# Patient Record
Sex: Female | Born: 1945 | Race: White | Hispanic: No | Marital: Single | Smoking: Former smoker
Health system: Southern US, Community
[De-identification: ages and names within clinical notes are randomized; demographics above are authoritative.]

## PROBLEM LIST (undated history)

## (undated) HISTORY — PX: MOLE REMOVAL: SHX2046

## (undated) HISTORY — PX: EYE SURGERY: SHX253

---

## 1997-06-28 ENCOUNTER — Other Ambulatory Visit: Admission: RE | Admit: 1997-06-28 | Discharge: 1997-06-28 | Payer: Self-pay | Admitting: *Deleted

## 1997-07-07 ENCOUNTER — Emergency Department (HOSPITAL_COMMUNITY): Admission: EM | Admit: 1997-07-07 | Discharge: 1997-07-07 | Payer: Self-pay

## 1997-07-11 ENCOUNTER — Ambulatory Visit (HOSPITAL_COMMUNITY): Admission: RE | Admit: 1997-07-11 | Discharge: 1997-07-11 | Payer: Self-pay

## 1997-11-09 ENCOUNTER — Ambulatory Visit (HOSPITAL_COMMUNITY): Admission: RE | Admit: 1997-11-09 | Discharge: 1997-11-09 | Payer: Self-pay | Admitting: *Deleted

## 1999-01-28 ENCOUNTER — Ambulatory Visit (HOSPITAL_COMMUNITY): Admission: RE | Admit: 1999-01-28 | Discharge: 1999-01-28 | Payer: Self-pay | Admitting: *Deleted

## 1999-04-15 ENCOUNTER — Other Ambulatory Visit: Admission: RE | Admit: 1999-04-15 | Discharge: 1999-04-15 | Payer: Self-pay | Admitting: *Deleted

## 1999-08-19 ENCOUNTER — Encounter: Admission: RE | Admit: 1999-08-19 | Discharge: 1999-10-07 | Payer: Self-pay | Admitting: Internal Medicine

## 2000-04-13 ENCOUNTER — Ambulatory Visit (HOSPITAL_COMMUNITY): Admission: RE | Admit: 2000-04-13 | Discharge: 2000-04-13 | Payer: Self-pay | Admitting: Internal Medicine

## 2000-04-13 ENCOUNTER — Encounter: Payer: Self-pay | Admitting: Family Medicine

## 2000-09-15 ENCOUNTER — Emergency Department (HOSPITAL_COMMUNITY): Admission: EM | Admit: 2000-09-15 | Discharge: 2000-09-16 | Payer: Self-pay | Admitting: Internal Medicine

## 2000-09-17 ENCOUNTER — Encounter: Admission: RE | Admit: 2000-09-17 | Discharge: 2000-09-17 | Payer: Self-pay | Admitting: Endocrinology

## 2000-09-17 ENCOUNTER — Encounter: Payer: Self-pay | Admitting: Endocrinology

## 2000-11-05 ENCOUNTER — Ambulatory Visit (HOSPITAL_COMMUNITY): Admission: RE | Admit: 2000-11-05 | Discharge: 2000-11-05 | Payer: Self-pay | Admitting: *Deleted

## 2005-12-26 ENCOUNTER — Encounter: Admission: RE | Admit: 2005-12-26 | Discharge: 2005-12-26 | Payer: Self-pay | Admitting: Internal Medicine

## 2016-02-05 ENCOUNTER — Telehealth: Payer: Self-pay | Admitting: Rheumatology

## 2016-02-05 NOTE — Telephone Encounter (Signed)
Patient called to check status of referral. Patient states her PCP sent it in December.

## 2016-02-07 NOTE — Telephone Encounter (Signed)
Okay to schedule

## 2016-04-13 NOTE — Progress Notes (Deleted)
   Office Visit Note  Patient: Carol Horn             Date of Birth: [birth year]           MRN: [id number]             PCP: No primary care provider on file. Referring: No ref. provider found Visit Date: 04/17/2016 Occupation: @GUAROCC @    Subjective:  No chief complaint on file.   History of Present Illness: Carol Horn is a 71 y.o. female ***   Activities of Daily Living:  Patient reports morning stiffness for *** {minute/hour:19697}.   Patient {ACTIONS;DENIES/REPORTS:21021675::"Denies"} nocturnal pain.  Difficulty dressing/grooming: {ACTIONS;DENIES/REPORTS:21021675::"Denies"} Difficulty climbing stairs: {ACTIONS;DENIES/REPORTS:21021675::"Denies"} Difficulty getting out of chair: {ACTIONS;DENIES/REPORTS:21021675::"Denies"} Difficulty using hands for taps, buttons, cutlery, and/or writing: {ACTIONS;DENIES/REPORTS:21021675::"Denies"}   No Rheumatology ROS completed.   PMFS History:  There are no active problems to display for this patient.   No past medical history on file.  No family history on file. No past surgical history on file. Social History   Social History Narrative  . No narrative on file     Objective: Vital Signs: There were no vitals taken for this visit.   Physical Exam   Musculoskeletal Exam: ***  CDAI Exam: No CDAI exam completed.    Investigation: No additional findings.   Imaging: No results found.  Speciality Comments: No specialty comments available.    Procedures:  No procedures performed Allergies: Patient has no allergy information on record.   Assessment / Plan:     Visit Diagnoses: No diagnosis found.    Orders: No orders of the defined types were placed in this encounter.  No orders of the defined types were placed in this encounter.   Face-to-face time spent with patient was *** minutes. 50% of time was spent in counseling and coordination of care.  Follow-Up Instructions: No Follow-up on  file.   Bo Merino, MD  Note - This record has been created using Editor, commissioning.  Chart creation errors have been sought, but may not always  have been located. Such creation errors do not reflect on  the standard of medical care.

## 2016-04-16 ENCOUNTER — Telehealth: Payer: Self-pay | Admitting: Radiology

## 2016-04-16 NOTE — Telephone Encounter (Signed)
Tried to call patient to get records sent. Her mobile number is busy. She had referral that was sent, unfortunately when she declined appointment the referral was apparently shredded, she then called back to schedule. We need information from her PCP  Will you try to call her later?

## 2016-04-16 NOTE — Telephone Encounter (Signed)
Tried calling numbers on file for patient. Mobile number was busy and there was no answer or voicemail available for the work number on file.

## 2016-04-17 ENCOUNTER — Ambulatory Visit: Payer: Self-pay | Admitting: Rheumatology

## 2016-05-04 NOTE — Progress Notes (Deleted)
   Office Visit Note  Patient: Carol Horn             Date of Birth: [birth year]           MRN: [id number]             PCP: No primary care provider on file. Referring: No ref. provider found Visit Date: 05/07/2016 Occupation: @GUAROCC @    Subjective:  No chief complaint on file.   History of Present Illness: Carol Horn is a 71 y.o. female ***   Activities of Daily Living:  Patient reports morning stiffness for *** {minute/hour:19697}.   Patient {ACTIONS;DENIES/REPORTS:21021675::"Denies"} nocturnal pain.  Difficulty dressing/grooming: {ACTIONS;DENIES/REPORTS:21021675::"Denies"} Difficulty climbing stairs: {ACTIONS;DENIES/REPORTS:21021675::"Denies"} Difficulty getting out of chair: {ACTIONS;DENIES/REPORTS:21021675::"Denies"} Difficulty using hands for taps, buttons, cutlery, and/or writing: {ACTIONS;DENIES/REPORTS:21021675::"Denies"}   No Rheumatology ROS completed.   PMFS History:  There are no active problems to display for this patient.   No past medical history on file.  No family history on file. No past surgical history on file. Social History   Social History Narrative  . No narrative on file     Objective: Vital Signs: There were no vitals taken for this visit.   Physical Exam   Musculoskeletal Exam: ***  CDAI Exam: No CDAI exam completed.    Investigation: No additional findings.   Imaging: No results found.  Speciality Comments: No specialty comments available.    Procedures:  No procedures performed Allergies: Patient has no allergy information on record.   Assessment / Plan:     Visit Diagnoses: No diagnosis found.    Orders: No orders of the defined types were placed in this encounter.  No orders of the defined types were placed in this encounter.   Face-to-face time spent with patient was *** minutes. 50% of time was spent in counseling and coordination of care.  Follow-Up Instructions: No Follow-up on  file.   Bo Merino, MD  Note - This record has been created using Editor, commissioning.  Chart creation errors have been sought, but may not always  have been located. Such creation errors do not reflect on  the standard of medical care.

## 2016-05-07 ENCOUNTER — Ambulatory Visit: Payer: Self-pay | Admitting: Rheumatology

## 2016-06-21 NOTE — Progress Notes (Signed)
Office Visit Note  Patient: Carol Horn             Date of Birth: October 11, 1945           MRN: [id number]             PCP: Chalmers Guest, FNP Referring: No ref. provider found Visit Date: 06/23/2016 Occupation: @GUAROCC @    Subjective:  Pain hands.   History of Present Illness: Carol Horn is a 71 y.o. female with history of psoriasis and psoriatic arthritis who is self-referred herself. According to patient she has had history of psoriasis since she was in her 13s. At age 28 she started having joint pain and worsening of her psoriasis. She describes joint pain in her hands and feet. She was seen by dermatologist who gave her topical agents and also sulfasalazine. She was also given light therapy. She's been under care of Dr. Lyman Speller a dermatologist at [facility] for the last few years. She states due to history of hepatitis C which was diagnosed at age 34 she could not take methotrexate. She was tried on several anti-inflammatories in the past. She was started on Enbrel in 2002. She states she did really well on Enbrel. But after 15 years of usage still working 2 years ago. In September 2017 she had to come off Enbrel due to a skin infection. In 2010 she was given her first dose of the Stelara followed by second dose in November 2017 she states she could not afford the medication as her co-pay was very high about $3000. She could not get any help from any of the foundations. She was not offered any appointments back with Dr. Lyman Speller. She states she has some leftover Enbrel injections which she's been taking every 2-3 weeks. She's been having a flare with increased rash over her extremities and increased joint pain. She describes pain mostly in her hands or feet or lower back. She could not take treatment for hepatitis C as she was also positive for hepatitis B. Patient had a colonoscopy at age 64 and not since then.  Activities of Daily Living:  Patient reports morning  stiffness for 1 hour.   Patient Reports nocturnal pain.  Difficulty dressing/grooming: Denies Difficulty climbing stairs: Reports Difficulty getting out of chair: Denies Difficulty using hands for taps, buttons, cutlery, and/or writing: Reports   Review of Systems  Constitutional: Positive for fatigue. Negative for night sweats, weight gain, weight loss and weakness.  HENT: Positive for mouth dryness. Negative for mouth sores, trouble swallowing, trouble swallowing and nose dryness.   Eyes: Positive for dryness. Negative for pain, redness and visual disturbance.  Respiratory: Negative for cough, shortness of breath and difficulty breathing.   Cardiovascular: Positive for hypertension. Negative for chest pain, palpitations, irregular heartbeat and swelling in legs/feet.  Gastrointestinal: Negative for blood in stool, constipation and diarrhea.  Endocrine: Negative for increased urination.  Genitourinary: Negative for vaginal dryness.  Musculoskeletal: Positive for arthralgias, joint pain, joint swelling, myalgias and myalgias. Negative for muscle weakness, morning stiffness and muscle tenderness.  Skin: Positive for rash. Negative for color change, hair loss, skin tightness, ulcers and sensitivity to sunlight.  Allergic/Immunologic: Negative for susceptible to infections.  Neurological: Negative for dizziness, memory loss and night sweats.  Hematological: Negative for swollen glands.  Psychiatric/Behavioral: Negative for depressed mood and sleep disturbance. The patient is not nervous/anxious.     PMFS History:  Patient Active Problem List   Diagnosis Date Noted  . Psoriasis  06/23/2016  . Psoriatic arthropathy ([place]) 06/23/2016  . High risk medication use 06/23/2016  . Chronic hepatitis C without hepatic coma ([place]) 06/23/2016    No past medical history on file.  No family history on file. No past surgical history on file. Social History   Social History Narrative  . No narrative  on file     Objective: Vital Signs: BP 128/76   Pulse 78   Resp 14   Ht 5\' 3"  (1.6 m)   Wt 172 lb (78 kg)   BMI 30.47 kg/m    Physical Exam  Constitutional: She is oriented to person, place, and time. She appears well-developed and well-nourished.  HENT:  Head: Normocephalic and atraumatic.  Eyes: Conjunctivae and EOM are normal.  Neck: Normal range of motion.  Cardiovascular: Normal rate, regular rhythm, normal heart sounds and intact distal pulses.   Pulmonary/Chest: Effort normal and breath sounds normal.  Abdominal: Soft. Bowel sounds are normal.  Lymphadenopathy:    She has no cervical adenopathy.  Neurological: She is alert and oriented to person, place, and time.  Skin: Skin is warm and dry. Capillary refill takes less than 2 seconds. Rash noted.  Erythematous plaques on extremities from psoriasis  Psychiatric: She has a normal mood and affect. Her behavior is normal.  Nursing note and vitals reviewed.    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 She has synovial thickening over bilateral PIP/DIP joints with no active synovitis noted. She has limited painful range of motion of her bilateral hip joints knee joints are good range of motion. She has some DIP PIP thickening in her feet with no active synovitis.  CDAI Exam: CDAI Homunculus Exam:   Tenderness:  Right hand: 2nd DIP, 3rd DIP, 4th DIP and 5th DIP Left hand: 2nd DIP, 3rd DIP, 4th DIP and 5th DIP RLE: acetabulofemoral LLE: acetabulofemoral  Joint Counts:  CDAI Tender Joint count: 0 CDAI Swollen Joint count: 0  Global Assessments:  Patient Global Assessment: 5 Provider Global Assessment: 5  CDAI Calculated Score: 10    Investigation: No additional findings.   Imaging: Xr Hip Unilat  W Or W/o Pelvis 2-3 Views Left  Result Date: 06/23/2016 No significant hip joint narrowing was noted. Mild SI joint changes were noted. Left hip joint was within normal limits.  Xr Hip Unilat W Or W/o Pelvis 2-3 Views Right  Result Date: 06/23/2016 No significant hip joint narrowing was noted. Right SI joint sclerosis was noted. 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 Erosive changes in left fifth MTP joint was noted. Intertarsal joint space narrowing was noted. A small calcaneal spur was noted. Impression: Findings are consistent with psoriatic arthritis with erosive disease.  Xr Foot 2 Views Right  Result Date: 06/23/2016 All MTP PIP/DIP narrowing was noted. Subluxation of PIP joints were noted. Erosive changes in her PIP joints were noted. Severe erosive changes noted in right fourth and fifth MTP joints. Intertarsal joint space narrowing was noted. Erosive changes and tarsal bones were noted. A small calcaneal spur was noted. Impression: These findings are consistent with psoriatic arthritis with erosive changes  Xr Hand 2 View Right  Result Date: 06/23/2016 Ankylosis of all DIP joints noted.  Severe PIP narrowing was noted. With subluxation of right third and fourth PIP joint and ankylosis of right fifth PIP joint. She has narrowing and erosion of her right first MCP joint some intercarpal joint and radiocarpal joint narrowing was noted. Erosion over the ulnar styloid was noted. Impression: These findings are consistent with psoriatic arthritis with erosive changes  Xr Knee 3 View Left  Result Date: 06/23/2016 Moderate medial compartment narrowing no chondrocalcinosis moderate patellofemoral narrowing. Impression: Moderate osteoarthritis and moderate chondromalacia patella  Xr Knee 3 View Right  Result Date: 06/23/2016 Moderate medial compartment of knee joint without  chondrocalcinosis mild patellofemoral narrowing noted. Impression: Moderate osteoarthritis and mild chondromalacia patella   Speciality Comments: No specialty comments available.    Procedures:  No procedures performed Allergies: Hydrochlorothiazide and Stelara [ustekinumab]   Assessment / Plan:     Visit Diagnoses: Psoriatic arthropathy ([place]): She continues to have some DIP PIP thickening but no active synovitis was noted. She's been on Enbrel since 2002. She tried to [facility] but had no response to that. She believes that Enbrel was not effective as it used to be. With her history of hepatitis B and hepatitis C her choices are very limited. She had difficulty getting her medications due to her insurance situation. And she could not get any patient assistance per patient. She's not seeing any dermatologist currently. She has severe psoriatic arthritis with erosive changes in her PIPs of her hands and also in her feet.  Psoriasis: She is active lesions on her extremities.  High risk medication use: She takes Enbrel subcutaneous every 2-3 weeks. Which is her leftover medication. Her choices are going to be very limited with hepatitis B and hepatitis C combination. Rutherford Nail could be a possible option I'm uncertain how effective it's going to be.  Chronic hepatitis C without hepatic coma ([place]): I will obtain hep C RNA Quant  History of hepatitis B: The labs obtained hep B DNA Quant  History of hypertension: Blood pressure appears to be well controlled  Congenital absence of forearm only, left  Family history of psoriatic arthritis - Mother    Orders: Orders Placed This Encounter  Procedures  . XR Hand 2 View Right  . XR KNEE 3 VIEW RIGHT  . XR KNEE 3 VIEW LEFT  . XR HIP UNILAT W OR W/O PELVIS 2-3 VIEWS LEFT  . XR HIP UNILAT W OR W/O PELVIS 2-3 VIEWS RIGHT  . XR Foot 2 Views Left  . XR Foot 2 Views Right  . CBC with Differential/Platelet  . COMPLETE METABOLIC PANEL WITH GFR  .  Sedimentation rate  . CK  . Antinuclear Antib (ANA)  . Quantiferon tb gold assay (blood)  . HIV antibody  . Hepatitis B DNA, ultraquantitative, PCR  . Hepatitis C RNA quantitative  . Serum protein electrophoresis with reflex  . IgG, IgA, IgM   No orders of the defined types were placed in this encounter.   Face-to-face time spent with patient was 50 minutes. 50% of time was spent in counseling and coordination of care.  Follow-Up Instructions: Return for Psoriatic arthritis, psoriasis.   Bo Merino, MD  Note - This record has been created using Editor, commissioning.  Chart creation errors have been sought, but may not always  have been located. Such creation errors do not reflect on  the standard of medical care.

## 2016-06-23 ENCOUNTER — Ambulatory Visit (INDEPENDENT_AMBULATORY_CARE_PROVIDER_SITE_OTHER): Payer: Medicare Other

## 2016-06-23 ENCOUNTER — Encounter: Payer: Self-pay | Admitting: Rheumatology

## 2016-06-23 ENCOUNTER — Encounter (INDEPENDENT_AMBULATORY_CARE_PROVIDER_SITE_OTHER): Payer: Self-pay

## 2016-06-23 ENCOUNTER — Ambulatory Visit (INDEPENDENT_AMBULATORY_CARE_PROVIDER_SITE_OTHER): Payer: Medicare Other | Admitting: Rheumatology

## 2016-06-23 VITALS — BP 128/76 | HR 78 | Resp 14 | Ht 63.0 in | Wt 172.0 lb

## 2016-06-23 DIAGNOSIS — M25552 Pain in left hip: Secondary | ICD-10-CM | POA: Diagnosis not present

## 2016-06-23 DIAGNOSIS — M25561 Pain in right knee: Secondary | ICD-10-CM

## 2016-06-23 DIAGNOSIS — L4050 Arthropathic psoriasis, unspecified: Secondary | ICD-10-CM

## 2016-06-23 DIAGNOSIS — L409 Psoriasis, unspecified: Secondary | ICD-10-CM

## 2016-06-23 DIAGNOSIS — M79671 Pain in right foot: Secondary | ICD-10-CM | POA: Diagnosis not present

## 2016-06-23 DIAGNOSIS — Q7152 Longitudinal reduction defect of left ulna: Secondary | ICD-10-CM

## 2016-06-23 DIAGNOSIS — Z8679 Personal history of other diseases of the circulatory system: Secondary | ICD-10-CM

## 2016-06-23 DIAGNOSIS — Z111 Encounter for screening for respiratory tuberculosis: Secondary | ICD-10-CM

## 2016-06-23 DIAGNOSIS — M25551 Pain in right hip: Secondary | ICD-10-CM | POA: Diagnosis not present

## 2016-06-23 DIAGNOSIS — Z840 Family history of diseases of the skin and subcutaneous tissue: Secondary | ICD-10-CM

## 2016-06-23 DIAGNOSIS — M25562 Pain in left knee: Secondary | ICD-10-CM | POA: Diagnosis not present

## 2016-06-23 DIAGNOSIS — Q7142 Longitudinal reduction defect of left radius: Secondary | ICD-10-CM | POA: Diagnosis not present

## 2016-06-23 DIAGNOSIS — Z79899 Other long term (current) drug therapy: Secondary | ICD-10-CM | POA: Diagnosis not present

## 2016-06-23 DIAGNOSIS — Z8619 Personal history of other infectious and parasitic diseases: Secondary | ICD-10-CM | POA: Diagnosis not present

## 2016-06-23 DIAGNOSIS — M79672 Pain in left foot: Secondary | ICD-10-CM | POA: Diagnosis not present

## 2016-06-23 DIAGNOSIS — Z8261 Family history of arthritis: Secondary | ICD-10-CM

## 2016-06-23 DIAGNOSIS — B182 Chronic viral hepatitis C: Secondary | ICD-10-CM

## 2016-06-23 DIAGNOSIS — M79641 Pain in right hand: Secondary | ICD-10-CM

## 2016-06-23 DIAGNOSIS — G8929 Other chronic pain: Secondary | ICD-10-CM

## 2016-06-23 DIAGNOSIS — Z114 Encounter for screening for human immunodeficiency virus [HIV]: Secondary | ICD-10-CM

## 2016-06-23 LAB — COMPLETE METABOLIC PANEL WITH GFR
ALT: 42 U/L — AB (ref 6–29)
AST: 83 U/L — AB (ref 10–35)
Albumin: 3.2 g/dL — ABNORMAL LOW (ref 3.6–5.1)
Alkaline Phosphatase: 114 U/L (ref 33–130)
BILIRUBIN TOTAL: 1.6 mg/dL — AB (ref 0.2–1.2)
BUN: 13 mg/dL (ref 7–25)
CO2: 25 mmol/L (ref 20–31)
Calcium: 9 mg/dL (ref 8.6–10.4)
Chloride: 109 mmol/L (ref 98–110)
Creat: 0.9 mg/dL (ref 0.60–0.93)
GFR, EST NON AFRICAN AMERICAN: 65 mL/min (ref >=60)
GFR, Est African American: 75 mL/min (ref >=60)
Glucose, Bld: 85 mg/dL (ref 65–99)
Potassium: 3.5 mmol/L (ref 3.5–5.3)
SODIUM: 143 mmol/L (ref 135–146)
TOTAL PROTEIN: 7.6 g/dL (ref 6.1–8.1)

## 2016-06-23 LAB — CBC WITH DIFFERENTIAL/PLATELET
BASOS PCT: 1 %
Basophils Absolute: 50 cells/uL (ref 0–200)
EOS PCT: 2 %
Eosinophils Absolute: 100 cells/uL (ref 15–500)
HCT: 38.4 % (ref 35.0–45.0)
Hemoglobin: 13.1 g/dL (ref 11.7–15.5)
LYMPHS ABS: 1550 {cells}/uL (ref 850–3900)
Lymphocytes Relative: 31 %
MCH: 34 pg — AB (ref 27.0–33.0)
MCHC: 34.1 g/dL (ref 32.0–36.0)
MCV: 99.7 fL (ref 80.0–100.0)
MONOS PCT: 13 %
MPV: 11.1 fL (ref 7.5–12.5)
Monocytes Absolute: 650 cells/uL (ref 200–950)
NEUTROS ABS: 2650 {cells}/uL (ref 1500–7800)
Neutrophils Relative %: 53 %
PLATELETS: 157 10*3/uL (ref 140–400)
RBC: 3.85 MIL/uL (ref 3.80–5.10)
RDW: 15.8 % — ABNORMAL HIGH (ref 11.0–15.0)
WBC: 5 10*3/uL (ref 3.8–10.8)

## 2016-06-23 NOTE — Progress Notes (Signed)
Pharmacy Note  Subjective:  Patient presents today to the [facility] to see Dr. Estanislado Pandy.  Rutherford Nail is being considered for patient's psoriatic arthritis.  Patient was seen by the pharmacist for counseling on Otezla.  Objective: CMP ordered today  Assessment/Plan:  Counseled patient that Rutherford Nail is a PDE 4 inhibitor that works to treat psoriasis and the joint pain and tenderness of psoriatic arthritis.  Counseled patient on purpose, proper use, and adverse effects of Otezla.  Reviewed the most common adverse effects of weight loss, depression, nausea/diarrhea/vomiting, headaches, and nasal congestion.  Provided patient with medication education material and answered all questions.  Patient consented to [place].  Will apply for [place] through [employer].  Will also assist patient in applying for [facility] patient assistance program as she is concerned about the cost of [place].  Patient was given patient portion of application.  Advised her to bring back patient portion along with required income information.  Patient voiced understanding and reports she will probably bring back [place] patient assistance application on Friday.  Will submit application once patient brings back her portion.      [name], Pharm.D., BCPS, CPP Clinical Pharmacist Pager: [phone] Phone: [phone] 06/23/2016 10:09 AM

## 2016-06-23 NOTE — Patient Instructions (Signed)
Apremilast oral tablets What is this medicine? APREMILAST (a PRE mil ast) is used to treat plaque psoriasis and psoriatic arthritis. This medicine may be used for other purposes; ask your health care provider or pharmacist if you have questions. COMMON BRAND NAME(S): Otezla What should I tell my health care provider before I take this medicine? They need to know if you have any of these conditions: -dehydration -kidney disease -mental illness -an unusual or allergic reaction to apremilast, other medicines, foods, dyes, or preservatives -pregnant or trying to get pregnant -breast-feeding How should I use this medicine? Take this medicine by mouth with a glass of water. Follow the directions on the prescription label. Do not cut, crush or chew this medicine. You can take it with or without food. If it upsets your stomach, take it with food. Take your medicine at regular intervals. Do not take it more often than directed. Do not stop taking except on your doctor's advice. Talk to your pediatrician regarding the use of this medicine in children. Special care may be needed. Overdosage: If you think you have taken too much of this medicine contact a poison control center or emergency room at once. NOTE: This medicine is only for you. Do not share this medicine with others. What if I miss a dose? If you miss a dose, take it as soon as you can. If it is almost time for your next dose, take only that dose. Do not take double or extra doses. What may interact with this medicine? This medicine may interact with the following medications: -certain medicines for seizures like carbamazepine, phenobarbital, phenytoin -rifampin This list may not describe all possible interactions. Give your health care provider a list of all the medicines, herbs, non-prescription drugs, or dietary supplements you use. Also tell them if you smoke, drink alcohol, or use illegal drugs. Some items may interact with your  medicine. What should I watch for while using this medicine? Tell your doctor or healthcare professional if your symptoms do not start to get better or if they get worse. Patients and their families should watch out for new or worsening depression or thoughts of suicide. Also watch out for sudden changes in feelings such as feeling anxious, agitated, panicky, irritable, hostile, aggressive, impulsive, severely restless, overly excited and hyperactive, or not being able to sleep. If this happens, call your health care professional. Check with your doctor or health care professional if you get an attack of severe diarrhea, nausea and vomiting, or if you sweat a lot. The loss of too much body fluid can make it dangerous for you to take this medicine. What side effects may I notice from receiving this medicine? Side effects that you should report to your doctor or health care professional as soon as possible: -depressed mood -weight loss Side effects that usually do not require medical attention (report to your doctor or health care professional if they continue or are bothersome): -diarrhea -headache -nausea, vomiting This list may not describe all possible side effects. Call your doctor for medical advice about side effects. You may report side effects to FDA at 1-800-FDA-1088. Where should I keep my medicine? Keep out of the reach of children. Store below 30 degrees C (86 degrees F). Throw away any unused medicine after the expiration date. NOTE: This sheet is a summary. It may not cover all possible information. If you have questions about this medicine, talk to your doctor, pharmacist, or health care provider.  2018 Elsevier/Gold Standard (2015-07-31 10:55:44)  

## 2016-06-24 ENCOUNTER — Other Ambulatory Visit: Payer: Self-pay | Admitting: Radiology

## 2016-06-24 DIAGNOSIS — D899 Disorder involving the immune mechanism, unspecified: Principal | ICD-10-CM

## 2016-06-24 DIAGNOSIS — D849 Immunodeficiency, unspecified: Secondary | ICD-10-CM

## 2016-06-24 LAB — IGG, IGA, IGM
IGA: 433 mg/dL (ref 81–463)
IGM, SERUM: 151 mg/dL (ref 48–271)
IgG (Immunoglobin G), Serum: 2749 mg/dL — ABNORMAL HIGH (ref 694–1618)

## 2016-06-24 LAB — HIV ANTIBODY (ROUTINE TESTING W REFLEX): HIV 1&2 Ab, 4th Generation: NONREACTIVE

## 2016-06-24 LAB — CK: Total CK: 66 U/L (ref 29–143)

## 2016-06-24 LAB — QUANTIFERON TB GOLD ASSAY (BLOOD)

## 2016-06-24 LAB — SEDIMENTATION RATE: Sed Rate: 11 mm/hr (ref 0–30)

## 2016-06-24 LAB — ANTI-NUCLEAR AB-TITER (ANA TITER)

## 2016-06-24 LAB — ANA: ANA: POSITIVE — AB

## 2016-06-25 ENCOUNTER — Telehealth: Payer: Self-pay

## 2016-06-25 LAB — PROTEIN ELECTROPHORESIS, SERUM, WITH REFLEX
ALPHA-2-GLOBULIN: 0.6 g/dL (ref 0.5–0.9)
Albumin ELP: 3.3 g/dL — ABNORMAL LOW (ref 3.8–4.8)
Alpha-1-Globulin: 0.3 g/dL (ref 0.2–0.3)
Beta 2: 0.3 g/dL (ref 0.2–0.5)
Beta Globulin: 0.5 g/dL (ref 0.4–0.6)
Gamma Globulin: 2.6 g/dL — ABNORMAL HIGH (ref 0.8–1.7)
Total Protein, Serum Electrophoresis: 7.6 g/dL (ref 6.1–8.1)

## 2016-06-25 LAB — HEPATITIS B DNA, ULTRAQUANTITATIVE, PCR: Hepatitis B DNA: <10 IU/mL

## 2016-06-25 LAB — HEPATITIS C RNA QUANTITATIVE
HCV Quantitative Log: 5.95 Log IU/mL — ABNORMAL HIGH
HCV Quantitative: 886000 IU/mL — ABNORMAL HIGH

## 2016-06-25 NOTE — Telephone Encounter (Signed)
Received a fax from [employer] regarding a prior authorization approval for Otezla through 01/25/17   Reference number:[id number] Phone number:[phone]  Will send document to scan center.  Praise Dolecki, [place], CPhT  2:40 PM

## 2016-06-25 NOTE — Telephone Encounter (Signed)
Submitted a prior authorization for [facility] through cover my meds. Will update once we receive a response.   Devion Chriscoe, [place], CPhT 11:33 AM

## 2016-06-26 ENCOUNTER — Telehealth: Payer: Self-pay | Admitting: Pharmacist

## 2016-06-26 ENCOUNTER — Other Ambulatory Visit: Payer: Self-pay

## 2016-06-26 DIAGNOSIS — D899 Disorder involving the immune mechanism, unspecified: Principal | ICD-10-CM

## 2016-06-26 DIAGNOSIS — D849 Immunodeficiency, unspecified: Secondary | ICD-10-CM

## 2016-06-26 NOTE — Telephone Encounter (Signed)
Patient came by the office and brought by [facility] patient assistance application and income statements.  Application was submitted to [facility] support.  Will update patient once we know status of application.   [name], Pharm.D., BCPS, CPP Clinical Pharmacist Pager: [phone] Phone: [phone] 06/26/2016 3:07 PM

## 2016-06-29 LAB — QUANTIFERON TB GOLD ASSAY (BLOOD)
INTERFERON GAMMA RELEASE ASSAY: NEGATIVE
Mitogen-Nil: 3.07 IU/mL
QUANTIFERON NIL VALUE: 0.04 [IU]/mL
Quantiferon Tb Ag Minus Nil Value: <0 IU/mL

## 2016-06-29 NOTE — Progress Notes (Signed)
WNL

## 2016-06-30 ENCOUNTER — Telehealth: Payer: Self-pay | Admitting: Rheumatology

## 2016-06-30 DIAGNOSIS — R899 Unspecified abnormal finding in specimens from other organs, systems and tissues: Secondary | ICD-10-CM

## 2016-06-30 DIAGNOSIS — B182 Chronic viral hepatitis C: Secondary | ICD-10-CM

## 2016-06-30 LAB — IFE INTERPRETATION

## 2016-06-30 NOTE — Telephone Encounter (Signed)
I advised patient that Carol Horn is still expensive with insurance which is why we are applying for [facility] patient assistance program.  We are still waiting on the program to process her application.  Will update her once we know status of application.   [name], Pharm.D., BCPS, CPP Clinical Pharmacist Pager: [phone] Phone: [phone] 06/30/2016 2:11 PM

## 2016-06-30 NOTE — Telephone Encounter (Signed)
-----   Message from Bo Merino, MD sent at 06/30/2016 12:52 PM EDT ----- Madaline Brilliant to apply for [place]. Refer tp Hepatitis clinic for tt of Hep C.Refer to hematology for abnormal IFE

## 2016-06-30 NOTE — Telephone Encounter (Signed)
Patient received authorization for [place] from [employer]. Patient wants to know next set. Please advise.

## 2016-06-30 NOTE — Progress Notes (Signed)
Ok to apply for [employer]. Refer tp Hepatitis clinic for tt of Hep C.Refer to hematology for abnormal IFE

## 2016-07-01 ENCOUNTER — Telehealth (INDEPENDENT_AMBULATORY_CARE_PROVIDER_SITE_OTHER): Payer: Self-pay | Admitting: Radiology

## 2016-07-01 NOTE — Telephone Encounter (Signed)
Patient calling she recently had lab work done with Dr. Estanislado Pandy. She has an appointment with her medical doctor Chalmers Guest on 6/21. She is wanting labs faxed to [phone]. I advised her they already may have access to these since [facility] is on EPIC. If unable to be faxed please call patient back.

## 2016-07-01 NOTE — Telephone Encounter (Signed)
Dr. D pt 

## 2016-07-01 NOTE — Telephone Encounter (Signed)
Patient advised labs have been faxed as requested to PCP.

## 2016-07-10 ENCOUNTER — Telehealth: Payer: Self-pay

## 2016-07-10 NOTE — Telephone Encounter (Signed)
Called otezla support plus to check status of patient's application. Spoke with Stanton Kidney who confirmed that the patient meets the qualifications and has been approved. We should receive a fax by Monday, June 18th. They will also reach out to patient to have medication shipped out.   Reference number: [id number] Phone number: [phone]  Spoke to patient to give her the update. She voices understanding and denies any questions at this time.  Agron Swiney, [place], CPhT 3:56 PM

## 2016-07-24 ENCOUNTER — Telehealth: Payer: Self-pay | Admitting: Rheumatology

## 2016-07-24 NOTE — Telephone Encounter (Signed)
Patient called stating that she has not started the new medication yet and is wanting to know if she needs to reschedule her NPT FU.  She has an appointment on Tuesday July 3.  Thank you.

## 2016-07-27 DIAGNOSIS — D696 Thrombocytopenia, unspecified: Secondary | ICD-10-CM | POA: Insufficient documentation

## 2016-07-27 NOTE — Telephone Encounter (Signed)
Left message to advise patient she should keep follow up appointment for 07/28/16

## 2016-07-27 NOTE — Progress Notes (Deleted)
   Office Visit Note  Patient: Carol Horn             Date of Birth: 30-Jan-1945           MRN: [id number]             PCP: Chalmers Guest, FNP Referring: No ref. provider found Visit Date: 07/28/2016 Occupation: @GUAROCC @    Subjective:  No chief complaint on file.   History of Present Illness: Carol Horn is a 71 y.o. female ***   Activities of Daily Living:  Patient reports morning stiffness for *** {minute/hour:19697}.   Patient {ACTIONS;DENIES/REPORTS:21021675::"Denies"} nocturnal pain.  Difficulty dressing/grooming: {ACTIONS;DENIES/REPORTS:21021675::"Denies"} Difficulty climbing stairs: {ACTIONS;DENIES/REPORTS:21021675::"Denies"} Difficulty getting out of chair: {ACTIONS;DENIES/REPORTS:21021675::"Denies"} Difficulty using hands for taps, buttons, cutlery, and/or writing: {ACTIONS;DENIES/REPORTS:21021675::"Denies"}   No Rheumatology ROS completed.   PMFS History:  Patient Active Problem List   Diagnosis Date Noted  . Thrombocytopenia ([place]) 07/27/2016  . Psoriasis 06/23/2016  . 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 High risk medication use 06/23/2016  . Chronic hepatitis C without hepatic coma ([place]) 06/23/2016    No past medical history on file.  No family history on file. No past surgical history on file. Social History   Social History Narrative  . No narrative on file     Objective: Vital Signs: There were no vitals taken for this visit.   Physical Exam   Musculoskeletal Exam: ***  CDAI Exam: No CDAI exam completed.    Investigation: Findings:  06/23/2016 ANA positive titer 1:80, Hepatitis C RNA quantitative elevated 886,000 Quant Log elevate 5.96,IgG 2,749 High , IgA 433 IgM 151, Serum protein electrophoresis with reflex A poorly defined band of restricted protein mobility is detected in the gamma globulins. It is unlikely this may represent a monoclonal protein, CK 66, Hepatitis B DNA, ultraquantitative, PCR not detected, HIV  antibody non reactive, IFE Interpretation abnormal protein not detected ,sedimentation rate 11, and on 06/26/2016 negative TB gold     Imaging: No results found.  Speciality Comments: No specialty comments available.    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  No orders of the defined types were placed in this encounter.   Face-to-face time spent with patient was *** minutes. 50% of time was spent in counseling and coordination of care.  Follow-Up Instructions: No Follow-up on file.   Bo Merino, MD  Note - This record has been created using Editor, commissioning.  Chart creation errors have been sought, but may not always  have been located. Such creation errors do not reflect on  the standard of medical care.

## 2016-07-28 ENCOUNTER — Ambulatory Visit: Payer: Self-pay | Admitting: Rheumatology

## 2016-08-06 ENCOUNTER — Encounter: Payer: Self-pay | Admitting: Hematology and Oncology

## 2016-08-06 ENCOUNTER — Telehealth: Payer: Self-pay | Admitting: Hematology and Oncology

## 2016-08-06 NOTE — Telephone Encounter (Signed)
Appt has been scheduled for the pt to see Dr. Lindi Adie on 7/27 at 12pm. Pt aware to arrive 30 minutes early. Address verified. Unable to verify insurance because pt didn't have it with her. Advised to make sure that she brings insurance card to appt. Pt voiced understanding. Letter mailed to the pt.

## 2016-08-10 ENCOUNTER — Telehealth: Payer: Self-pay | Admitting: Pharmacist

## 2016-08-10 NOTE — Telephone Encounter (Signed)
I called patient to discuss Otezla.  Patient reports she started [place] on 07/31/16.  She reports diarrhea and headache since starting Otezla.  She reports tolerating the morning dose of Otezla well, but is not able to tolerate the evening dose.  I advised patient to try reducing Otezla dose to 30 mg daily to see if symptoms improve.  Also discussed use of loperamide over the counter to help control diarrhea symptoms.  Patient voiced understanding and denies any further questions.  I advised her to call if symptoms worsen or do not improve with these changes.    [name], Pharm.D., BCPS, CPP Clinical Pharmacist Pager: [phone] Phone: [phone] 08/10/2016 10:05 AM

## 2016-08-10 NOTE — Telephone Encounter (Signed)
-----   Message from Altamese Dilling sent at 08/10/2016  8:42 AM EDT ----- Regarding: New Prescription for [place]: [phone] Patient would like to talk with you about a new prescription for [facility]

## 2016-08-11 ENCOUNTER — Encounter: Payer: Self-pay | Admitting: Hematology and Oncology

## 2016-08-14 ENCOUNTER — Telehealth: Payer: Self-pay | Admitting: Hematology

## 2016-08-14 NOTE — Telephone Encounter (Signed)
Pt cld to reschedule appt to 8/7 at 3pm w/[place].

## 2016-08-17 ENCOUNTER — Telehealth: Payer: Self-pay | Admitting: Pharmacist

## 2016-08-17 NOTE — Telephone Encounter (Signed)
I called Mrs. Kasa to follow up on medication tolerance to [place].  She reports she is doing much better.  She tried to reduce Otezla to once daily and pain increased.  She then increased dose back to Otezla 30 mg twice daily.  She reports the nausea and diarrhea have mostly resolved.  She also reports improvement in the headaches.  Patient reports she does have an intermittent cough.  Reviewed that [place] can be associated with cough (<2%).  Advised patient to see her primary care if cough does not resolve.  Patient denies any further questions or concerns regarding Otezla at this time.   [name], Pharm.D., BCPS, CPP Clinical Pharmacist Pager: [phone] Phone: [phone] 08/17/2016 9:41 AM

## 2016-08-21 ENCOUNTER — Encounter: Payer: Self-pay | Admitting: Hematology and Oncology

## 2016-09-01 ENCOUNTER — Telehealth: Payer: Self-pay | Admitting: Hematology

## 2016-09-01 ENCOUNTER — Ambulatory Visit (HOSPITAL_BASED_OUTPATIENT_CLINIC_OR_DEPARTMENT_OTHER): Payer: Medicare Other | Admitting: Hematology

## 2016-09-01 ENCOUNTER — Ambulatory Visit (HOSPITAL_BASED_OUTPATIENT_CLINIC_OR_DEPARTMENT_OTHER): Payer: Medicare Other

## 2016-09-01 ENCOUNTER — Encounter: Payer: Self-pay | Admitting: Hematology

## 2016-09-01 VITALS — BP 123/62 | HR 75 | Temp 98.5°F | Resp 18 | Ht 63.0 in | Wt 165.7 lb

## 2016-09-01 DIAGNOSIS — L4050 Arthropathic psoriasis, unspecified: Secondary | ICD-10-CM | POA: Diagnosis not present

## 2016-09-01 DIAGNOSIS — D472 Monoclonal gammopathy: Secondary | ICD-10-CM | POA: Diagnosis not present

## 2016-09-01 DIAGNOSIS — D890 Polyclonal hypergammaglobulinemia: Secondary | ICD-10-CM | POA: Diagnosis not present

## 2016-09-01 DIAGNOSIS — R740 Nonspecific elevation of levels of transaminase and lactic acid dehydrogenase [LDH]: Secondary | ICD-10-CM | POA: Diagnosis not present

## 2016-09-01 DIAGNOSIS — Z87891 Personal history of nicotine dependence: Secondary | ICD-10-CM | POA: Diagnosis not present

## 2016-09-01 DIAGNOSIS — B182 Chronic viral hepatitis C: Secondary | ICD-10-CM | POA: Diagnosis not present

## 2016-09-01 LAB — COMPREHENSIVE METABOLIC PANEL
ALBUMIN: 3.5 g/dL (ref 3.5–5.0)
ALK PHOS: 112 U/L (ref 40–150)
ALT: 40 U/L (ref 0–55)
AST: 75 U/L — AB (ref 5–34)
Anion Gap: 10 mEq/L (ref 3–11)
BILIRUBIN TOTAL: 1.45 mg/dL — AB (ref 0.20–1.20)
BUN: 10.7 mg/dL (ref 7.0–26.0)
CALCIUM: 9.7 mg/dL (ref 8.4–10.4)
CO2: 21 mEq/L — ABNORMAL LOW (ref 22–29)
CREATININE: 1 mg/dL (ref 0.6–1.1)
Chloride: 110 mEq/L — ABNORMAL HIGH (ref 98–109)
EGFR: 58 mL/min/{1.73_m2} — ABNORMAL LOW (ref >90)
Glucose: 83 mg/dl (ref 70–140)
POTASSIUM: 3.3 meq/L — AB (ref 3.5–5.1)
Sodium: 142 mEq/L (ref 136–145)
TOTAL PROTEIN: 8 g/dL (ref 6.4–8.3)

## 2016-09-01 LAB — CBC & DIFF AND RETIC
BASO%: 1 % (ref 0.0–2.0)
BASOS ABS: 0.1 10*3/uL (ref 0.0–0.1)
EOS ABS: 0.1 10*3/uL (ref 0.0–0.5)
EOS%: 2.2 % (ref 0.0–7.0)
HEMATOCRIT: 38.2 % (ref 34.8–46.6)
HEMOGLOBIN: 13 g/dL (ref 11.6–15.9)
Immature Retic Fract: 3.2 % (ref 1.60–10.00)
LYMPH%: 29.6 % (ref 14.0–49.7)
MCH: 34.1 pg — AB (ref 25.1–34.0)
MCHC: 34 g/dL (ref 31.5–36.0)
MCV: 100.3 fL (ref 79.5–101.0)
MONO#: 0.7 10*3/uL (ref 0.1–0.9)
MONO%: 10.6 % (ref 0.0–14.0)
NEUT#: 3.5 10*3/uL (ref 1.5–6.5)
NEUT%: 56.6 % (ref 38.4–76.8)
Platelets: 160 10*3/uL (ref 145–400)
RBC: 3.81 10*6/uL (ref 3.70–5.45)
RDW: 13.6 % (ref 11.2–14.5)
RETIC %: 1.48 % (ref 0.70–2.10)
Retic Ct Abs: 56.39 10*3/uL (ref 33.70–90.70)
WBC: 6.2 10*3/uL (ref 3.9–10.3)
lymph#: 1.9 10*3/uL (ref 0.9–3.3)

## 2016-09-01 NOTE — Patient Instructions (Signed)
Thank you for choosing [facility] to provide your oncology and hematology care.  To afford each patient quality time with our providers, please arrive 30 minutes before your scheduled appointment time.  If you arrive late for your appointment, you may be asked to reschedule.  We strive to give you quality time with our providers, and arriving late affects you and other patients whose appointments are after yours.   If you are a no show for multiple scheduled visits, you may be dismissed from the clinic at the providers discretion.    Again, thank you for choosing [facility], our hope is that these requests will decrease the amount of time that you wait before being seen by our physicians.  ______________________________________________________________________  Should you have questions after your visit to the [facility], please contact our office at [phone] between the hours of 8:30 and 4:30 p.m.    Voicemails left after 4:30p.m will not be returned until the following business day.    For prescription refill requests, please have your pharmacy contact us directly.  Please also try to allow 48 hours for prescription requests.    Please contact the scheduling department for questions regarding scheduling.  For scheduling of procedures such as PET scans, CT scans, MRI, Ultrasound, etc please contact central scheduling at [phone].    Resources For Cancer Patients and Caregivers:   Oncolink.org:  A wonderful resource for patients and healthcare providers for information regarding your disease, ways to tract your treatment, what to expect, etc.     American Cancer Society:  800-227-2345  Can help patients locate various types of support and financial assistance  Cancer Care: 1-800-813-HOPE (4673) Provides financial assistance, online support groups, medication/co-pay assistance.    [place] DSS:  336-641-3447 Where to apply for food  stamps, Medicaid, and utility assistance  Medicare Rights Center: 800-333-4114 Helps people with Medicare understand their rights and benefits, navigate the Medicare system, and secure the quality healthcare they deserve  SCAT: 336-333-6589 [place] [employer]'s shared-ride transportation service for eligible riders who have a disability that prevents them from riding the fixed route bus.    For additional information on assistance programs please contact our social worker:   Grier Hock/Abigail Elmore:  [phone]            

## 2016-09-01 NOTE — Telephone Encounter (Signed)
Scheduled appt per 8/7 los - Gave patient AVS and calender per los.  

## 2016-09-02 ENCOUNTER — Telehealth: Payer: Self-pay | Admitting: Rheumatology

## 2016-09-02 LAB — KAPPA/LAMBDA LIGHT CHAINS
IG LAMBDA FREE LIGHT CHAIN: 75.1 mg/L — AB (ref 5.7–26.3)
Ig Kappa Free Light Chain: 85 mg/L — ABNORMAL HIGH (ref 3.3–19.4)
Kappa/Lambda FluidC Ratio: 1.13 (ref 0.26–1.65)

## 2016-09-02 NOTE — Telephone Encounter (Signed)
I spoke to patient regarding Otezla.  She reports she is having continuous stomach upset with [place].  She denies nausea.  She reports intermittent diarrhea.  She saw her primary care provider yesterday and reports she has lost 20 pounds.  She reports the [place] is helping with the pain, but not as much as Enbrel.  She asked about going back on Enbrel and I advised she would need appointment to discuss treatment options.    She has history of positive hepatitis C with hepatitis C virus RNA of 886,600 on 06/23/16.  She was referred to ID Clinic but I do not think she has been seen.  She is scheduled for follow up with you on 10/02/16.  She reports she is willing to continue taking the medication for another month to see if symptoms improve, but she wanted your advise.

## 2016-09-02 NOTE — Telephone Encounter (Signed)
Patient is requesting a call from Dr. Koleen Nimrod about [place]. She states she has been on the medication for over 30 days now and is still not adjusting well. She says her stomach hurts everyday while on the medication. Please call patient.

## 2016-09-02 NOTE — Telephone Encounter (Signed)
Attempted to reach patient.  I left a message asking her to call me back.

## 2016-09-03 LAB — MULTIPLE MYELOMA PANEL, SERUM
ALBUMIN SERPL ELPH-MCNC: 3.3 g/dL (ref 2.9–4.4)
Albumin/Glob SerPl: 0.8 (ref 0.7–1.7)
Alpha 1: 0.3 g/dL (ref 0.0–0.4)
Alpha2 Glob SerPl Elph-Mcnc: 0.5 g/dL (ref 0.4–1.0)
B-GLOBULIN SERPL ELPH-MCNC: 1 g/dL (ref 0.7–1.3)
GAMMA GLOB SERPL ELPH-MCNC: 2.5 g/dL — AB (ref 0.4–1.8)
GLOBULIN, TOTAL: 4.3 g/dL — AB (ref 2.2–3.9)
IgA, Qn, Serum: 426 mg/dL — ABNORMAL HIGH (ref 87–352)
IgG, Qn, Serum: 2363 mg/dL — ABNORMAL HIGH (ref 700–1600)
IgM, Qn, Serum: 152 mg/dL (ref 26–217)
Total Protein: 7.6 g/dL (ref 6.0–8.5)

## 2016-09-03 NOTE — Telephone Encounter (Signed)
She will continue notice of her right now. When she sees the ID physician we can start on Enbrel.

## 2016-09-03 NOTE — Telephone Encounter (Signed)
Discussed with Dr. Estanislado Pandy who states patient can either continue Otezla until her follow up or we could consider switching back to Enbrel.  I reviewed options with patient.  She wants to continue [facility] for another month to see if stomach upset improved.  Advised that if decision is made to restart Enbrel in the future, she would likely need to come in for her first injection if it has been more than 3 months since her most recent injection.  Patient voiced understanding.   I asked patient about ID referral.  She confirms she has an appointment scheduled for Monday, 09/07/16.   [name], Pharm.D., BCPS, CPP Clinical Pharmacist Pager: [phone] Phone: [phone] 09/03/2016 12:28 PM

## 2016-09-04 NOTE — Progress Notes (Signed)
[facility]    HEMATOLOGY/ONCOLOGY CONSULTATION NOTE  Date of Service: 09/04/2016  Patient Care Team: Chalmers Guest, FNP as PCP - General (Family Medicine)  CHIEF COMPLAINTS/PURPOSE OF CONSULTATION:   Monoclonal paraproteinemia   HISTORY OF PRESENTING ILLNESS:   Carol Horn is a wonderful 71 y.o. female who has been referred to [place] by Dr .Chalmers Guest, FNP /Dr Bo Merino MD for evaluation and management of Abnormal IFE/Monoclonal Paraproteinemia.  Patient has a history of chronic hepatitis C and B not previously treated, psoriasis with psoriatic arthritis, hypertension who was seen by Dr. Estanislado Pandy for continued management of her psoriatic arthritis. She has previously been on Enbrel since 2002 and also tried Stelara. Recently the Enbrel was not as effective.  she has recently been started on Otezla for treatment and has been having some diarrhea related to this .  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 IFE showed some monoclonal IgA lambda and IgG kappa monoclonal proteins .  She was referred to [place] for further evaluation of this monoclonal paraproteinemia . At the time her CBC showed no anemia with a hemoglobin of 13.1. Normal WBC count of 5 k and normal platelet count of 157k. Her CMP shows normal creatinine of 0.9 with no hypercalcemia .  She notes joint pains from her psoriatic arthritis but no focal bone pains over spines, ribs . She has been referred for evaluation and management of chronic hepatitis C to the infectious disease clinic .   no significant new fatigue or change in functional status .    MEDICAL HISTORY:  #1 psoriasis #2 psoriatic arthritis #3 chronic hepatitis C and hepatitis B  #4 Left limb developmental abnormality. #5 hypertension  SURGICAL HISTORY: History reviewed. No pertinent surgical history.  SOCIAL  HISTORY: Social History   Social History  . Marital status: Single    Spouse name: N/A  . Number of children: N/A  . Years of education: N/A   Occupational History  . Not on file.   Social History Main Topics  . Smoking status: Former Smoker    Quit date: 01/26/1974  . Smokeless tobacco: Never Used  . Alcohol use No  . Drug use: No  . Sexual activity: Not on file   Other Topics Concern  . Not on file   Social History Narrative  . No narrative on file    FAMILY HISTORY: History reviewed. No pertinent family history.  ALLERGIES:  is allergic to hydrochlorothiazide and stelara [ustekinumab].  MEDICATIONS:  Current Outpatient Prescriptions  Medication Sig Dispense Refill  . amLODipine (NORVASC) 5 MG tablet Take 5 mg by mouth.    . Apremilast (OTEZLA) 30 MG TABS Take 30 mg by mouth 2 (two) times daily.    . furosemide (LASIX) 20 MG tablet Take 1 tablet by mouth daily as needed for swelling.    Marland Kitchen KRILL OIL PO Take by mouth.    . losartan (COZAAR) 100 MG tablet TAKE 1 TABLET BY MOUTH DAILY.    . Multiple Vitamin (MULTI-VITAMINS) TABS Take by mouth.    . potassium chloride SA (K-DUR,KLOR-CON) 20 MEQ tablet Take 1 tablet by mouth once daily.    Marland Kitchen triamcinolone ointment (KENALOG) 0.1 % Apply topically.     No current facility-administered medications for this visit.     REVIEW OF SYSTEMS:    10 Point review of Systems was done is  negative except as noted above.  PHYSICAL EXAMINATION: ECOG PERFORMANCE STATUS: 1 - Symptomatic but completely ambulatory  . Vitals:   09/01/16 1504  BP: 123/62  Pulse: 75  Resp: 18  Temp: 98.5 F (36.9 C)  SpO2: 98%   Filed Weights   09/01/16 1504  Weight: 165 lb 11.2 oz (75.2 kg)   .Body mass index is 29.35 kg/m.  GENERAL:alert, in no acute distress and comfortable SKIN: no acute rashes, no significant lesions EYES: conjunctiva are pink and non-injected, sclera anicteric OROPHARYNX: MMM, no exudates, no oropharyngeal erythema  or ulceration NECK: supple, no JVD LYMPH:  no palpable lymphadenopathy in the cervical, axillary or inguinal regions LUNGS: clear to auscultation b/l with normal respiratory effort HEART: regular rate & rhythm ABDOMEN:  normoactive bowel sounds , non tender, not distended.No overt palpable splenomegaly  Extremity: no pedal edema PSYCH: alert & oriented x 3 with fluent speech NEURO: no focal motor/sensory deficits  LABORATORY DATA:  I have reviewed the data as listed  . CBC Latest Ref Rng & Units 09/01/2016 06/23/2016  WBC 3.9 - 10.3 10e3/uL 6.2 5.0  Hemoglobin 11.6 - 15.9 g/dL 13.0 13.1  Hematocrit 34.8 - 46.6 % 38.2 38.4  Platelets 145 - 400 10e3/uL 160 157   . CBC    Component Value Date/Time   WBC 6.2 09/01/2016 1545   WBC 5.0 06/23/2016 1004   RBC 3.81 09/01/2016 1545   RBC 3.85 06/23/2016 1004   HGB 13.0 09/01/2016 1545   HCT 38.2 09/01/2016 1545   PLT 160 09/01/2016 1545   MCV 100.3 09/01/2016 1545   MCH 34.1 (H) 09/01/2016 1545   MCH 34.0 (H) 06/23/2016 1004   MCHC 34.0 09/01/2016 1545   MCHC 34.1 06/23/2016 1004   RDW 13.6 09/01/2016 1545   LYMPHSABS 1.9 09/01/2016 1545   MONOABS 0.7 09/01/2016 1545   EOSABS 0.1 09/01/2016 1545   BASOSABS 0.1 09/01/2016 1545    . CMP Latest Ref Rng & Units 09/01/2016 09/01/2016 06/23/2016  Glucose 70 - 140 mg/dl 83 - 85  BUN 7.0 - 26.0 mg/dL 10.7 - 13  Creatinine 0.6 - 1.1 mg/dL 1.0 - 0.90  Sodium 136 - 145 mEq/L 142 - 143  Potassium 3.5 - 5.1 mEq/L 3.3(L) - 3.5  Chloride 98 - 110 mmol/L - - 109  CO2 22 - 29 mEq/L 21(L) - 25  Calcium 8.4 - 10.4 mg/dL 9.7 - 9.0  Total Protein 6.0 - 8.5 g/dL 8.0 7.6 7.6  Total Bilirubin 0.20 - 1.20 mg/dL 1.45(H) - 1.6(H)  Alkaline Phos 40 - 150 U/L 112 - 114  AST 5 - 34 U/L 75(H) - 83(H)  ALT 0 - 55 U/L 40 - 42(H)       RADIOGRAPHIC STUDIES: I have personally reviewed the radiological images as listed and agreed with the findings in the report. No results found.  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 This is likely related to the patient's Chronic hepatitis C/hepatitis B or Chronic inflammation from her psoriatic arthritis.  This is unlikely to reflect a primary plasma cell dyscrasia or lymphoma at this time. Monoclonal protein is only noted on IFE and there is no measurable M spike. Serum kappa lambda free light chain ratio is within normal limits.  #2 polyclonal gammopathy related to her autoimmune condition and chronic hepatitis.  #3 chronically elevated  transaminases related to chronic hepatitis C.  Plan -I discussed the lab results with the patient in details and answered all her questions. 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 No lymphadenopathy or hepatosplenomegaly to suggest overt lymphoma at this time. -No anemia hypercalcemia related insufficiency or focal bone pains to suggest multiple myeloma on an overt plasma cell dyscrasia -Would hold off on a bone marrow biopsy at this time -Continue follow-up with rheumatology to optimize treatment for her psoriasis and psoriatic arthritis. -Follow-up with infectious disease clinic to treat hepatitis C. -Continue follow-up with primary care physician for management of other medical comorbidities. -We will plan to repeat her paraproteinemia labs in 6 months and if stable will have her continue follow-up with her primary care physician. -She has been counseled on importance symptoms to look or 4 including new lymph nodes unexplained fevers chills or night sweats and unexplained weight loss though some of these constitutional symptoms can occur in the setting of her autoimmune condition.  Labs today RTC with Dr Irene Limbo in 6 months with rpt  labs   All of the patients questions were answered with apparent satisfaction. The patient knows to call the clinic with any problems, questions or concerns.  I spent 40 minutes counseling the patient face to face. The total time spent in the appointment was 50 minutes and more than 50% was on counseling and direct patient cares.    Sullivan Lone MD [place] AAHIVMS [facility] Hematology/Oncology Physician [place]  (Office):       [phone] (Work cell):  [phone] (Fax):           [phone]  09/04/2016 5:02 PM

## 2016-09-10 ENCOUNTER — Ambulatory Visit: Payer: Self-pay | Admitting: Rheumatology

## 2016-09-10 ENCOUNTER — Other Ambulatory Visit (HOSPITAL_COMMUNITY): Payer: Self-pay | Admitting: Nurse Practitioner

## 2016-09-10 ENCOUNTER — Telehealth (INDEPENDENT_AMBULATORY_CARE_PROVIDER_SITE_OTHER): Payer: Self-pay | Admitting: Rheumatology

## 2016-09-10 DIAGNOSIS — B182 Chronic viral hepatitis C: Secondary | ICD-10-CM

## 2016-09-10 NOTE — Telephone Encounter (Signed)
Patient would like to know if she can pick up a sample of Otezla. There was a delay in her getting meds. It has been a week since she has meds. Please call to advise.

## 2016-09-11 NOTE — Telephone Encounter (Signed)
ok 

## 2016-09-11 NOTE — Telephone Encounter (Signed)
Called her to advise / she has now gotten the refill from the pharmacy

## 2016-09-11 NOTE — Telephone Encounter (Signed)
06/23/16 last visit  10/02/16 next visit  Can we discuss the [place] ? I see notes that are concerning and want to make sure I am not missing something

## 2016-09-16 ENCOUNTER — Telehealth: Payer: Self-pay | Admitting: Rheumatology

## 2016-09-16 ENCOUNTER — Ambulatory Visit (HOSPITAL_COMMUNITY): Payer: Medicare Other

## 2016-09-16 NOTE — Telephone Encounter (Signed)
Pharmacy left a message requesting new rx for [facility] Patient contacted them requesting a refill. They did not have a refill with rx. Please call with new rx

## 2016-09-17 MED ORDER — APREMILAST 30 MG PO TABS
30.0000 mg | ORAL_TABLET | Freq: Two times a day (BID) | ORAL | 0 refills | Status: DC
Start: 2016-09-17 — End: 2016-12-02

## 2016-09-17 NOTE — Telephone Encounter (Signed)
Prescription faxed to [facility].

## 2016-09-17 NOTE — Telephone Encounter (Signed)
Last Visit: 06/23/16 Next Visit: 10/02/16  Okay to refill per Dr. Estanislado Pandy

## 2016-09-18 ENCOUNTER — Ambulatory Visit (HOSPITAL_COMMUNITY): Payer: Medicare Other

## 2016-09-22 ENCOUNTER — Ambulatory Visit (HOSPITAL_COMMUNITY)
Admission: RE | Admit: 2016-09-22 | Discharge: 2016-09-22 | Disposition: A | Discharge status: Home / Self Care | Payer: Medicare Other | Source: Ambulatory Visit | Attending: Nurse Practitioner | Admitting: Nurse Practitioner

## 2016-09-22 DIAGNOSIS — K769 Liver disease, unspecified: Secondary | ICD-10-CM | POA: Diagnosis not present

## 2016-09-22 DIAGNOSIS — K740 Hepatic fibrosis: Secondary | ICD-10-CM | POA: Insufficient documentation

## 2016-09-22 DIAGNOSIS — B182 Chronic viral hepatitis C: Secondary | ICD-10-CM | POA: Diagnosis present

## 2016-09-30 NOTE — Progress Notes (Signed)
Office Visit Note  Patient: Carol Horn             Date of Birth: [birth year]           MRN: [id number]             PCP: Chalmers Guest, [facility] Referring: Chalmers Guest, FNP Visit Date: 10/02/2016 Occupation: @GUAROCC @    Subjective:  Pain in feet and right hand.   History of Present Illness: Carol Horn is a 71 y.o. female with history of psoriatic arthritis and psoriasis. She states she's been taking [place] since July. She has noticed improvement in her rash. She had some diarrhea while she was taking otezla and she reduced her dose to 1 a day. She recently increased it to twice a day as she was experiencing increased joint pain and stiffness in her right hand and her feet. She denies any joint swelling currently. She continues to have discomfort. She went to see hematologist Dr. Irene Limbo who felt her gammaglobulin elevation is related to hepatitis C. She's also going to hepatitis C clinic and has follow-up appointment coming up.  Activities of Daily Living:  Patient reports morning stiffness for 1 hour.   Patient Reports nocturnal pain.  Difficulty dressing/grooming: Denies Difficulty climbing stairs: Denies Difficulty getting out of chair: Denies Difficulty using hands for taps, buttons, cutlery, and/or writing: Denies   Review of Systems  Constitutional: Negative.  Positive for weakness. Negative for fatigue, night sweats, weight gain and weight loss.  HENT: Positive for mouth dryness. Negative for mouth sores, trouble swallowing, trouble swallowing and nose dryness.   Eyes: Negative for pain, redness, visual disturbance and dryness.  Respiratory: Negative for cough, shortness of breath and difficulty breathing.   Cardiovascular: Positive for hypertension. Negative for chest pain, palpitations, irregular heartbeat and swelling in legs/feet.  Gastrointestinal: Positive for diarrhea. Negative for blood in stool and constipation.  Endocrine: Negative.  Negative for  increased urination.  Genitourinary: Negative.  Negative for nocturia and vaginal dryness.  Musculoskeletal: Positive for arthralgias, joint pain, joint swelling, muscle weakness and morning stiffness. Negative for myalgias, muscle tenderness and myalgias.  Skin: Negative.  Negative for color change, rash, hair loss, skin tightness, ulcers and sensitivity to sunlight.  Allergic/Immunologic: Negative for susceptible to infections.  Neurological: Positive for memory loss. Negative for dizziness, headaches and night sweats.  Hematological: Negative.  Negative for swollen glands.  Psychiatric/Behavioral: Negative.  Negative for depressed mood and sleep disturbance. The patient is not nervous/anxious.     PMFS History:  Patient Active Problem List   Diagnosis Date Noted  . Thrombocytopenia ([place]) 07/27/2016  . Psoriasis 06/23/2016  . Psoriatic arthropathy ([place]) 06/23/2016  . High risk medication use 06/23/2016  . Chronic hepatitis C without hepatic coma ([place]) 06/23/2016    No past medical history on file.  No family history on file. No past surgical history on file. Social History   Social History Narrative  . No narrative on file     Objective: Vital Signs: BP 135/77   Pulse 80   Resp 14   Ht 5' 3"  (1.6 m)   Wt 167 lb (75.8 kg)   BMI 29.58 kg/m    Physical Exam  Constitutional: She is oriented to person, place, and time. She appears well-developed and well-nourished.  HENT:  Head: Normocephalic and atraumatic.  Eyes: Conjunctivae and EOM are normal.  Neck: Normal range of motion.  Cardiovascular: Normal rate, regular rhythm, normal heart sounds and intact distal pulses.  Pulmonary/Chest: Effort normal and breath sounds normal.  Abdominal: Soft. Bowel sounds are normal.  Musculoskeletal: Normal range of motion.  Congenital absence of left forearm  Lymphadenopathy:    She has no cervical adenopathy.  Neurological: She is alert and oriented to person, place, and time.    Skin: Skin is warm and dry. Capillary refill takes less than 2 seconds.  She has psoriasis patches of bilateral knee joints and shin.  Psychiatric: She has a normal mood and affect. Her behavior is normal.  Nursing note and vitals reviewed.    Musculoskeletal Exam: C-spine and thoracic lumbar spine. Shoulder joints elbow joints wrist joints she is some synovial over bilateral wrist joints PIP/DIP joints but no synovitis was noted. She has tenderness at this her PIP joints. Hip joints knee joints are good range of motion. She is tenderness on palpation across her MTPs and PIPs of her feet but no synovitis was noted.  CDAI Exam: No CDAI exam completed.    Investigation: No additional findings. CBC Latest Ref Rng & Units 09/01/2016 06/23/2016  WBC 3.9 - 10.3 10e3/uL 6.2 5.0  Hemoglobin 11.6 - 15.9 g/dL 13.0 13.1  Hematocrit 34.8 - 46.6 % 38.2 38.4  Platelets 145 - 400 10e3/uL 160 157   CMP     Component Value Date/Time   NA 142 09/01/2016 1545   K 3.3 (L) 09/01/2016 1545   CL 109 06/23/2016 1004   CO2 21 (L) 09/01/2016 1545   GLUCOSE 83 09/01/2016 1545   BUN 10.7 09/01/2016 1545   CREATININE 1.0 09/01/2016 1545   CALCIUM 9.7 09/01/2016 1545   PROT 7.6 09/01/2016 1545   PROT 8.0 09/01/2016 1545   ALBUMIN 3.5 09/01/2016 1545   AST 75 (H) 09/01/2016 1545   ALT 40 09/01/2016 1545   ALKPHOS 112 09/01/2016 1545   BILITOT 1.45 (H) 09/01/2016 1545   GFRNONAA 65 06/23/2016 1004   GFRAA 75 06/23/2016 1004  06/23/2016 IgG 2000 1749, IFE abnormal, hepatitis B negative, hepatitis C quant 886,000, ANA 1:80 nucleolar, CK 66, ESR 11  Imaging: US Abdomen Complete W/elastography  Result Date: 09/22/2016 CLINICAL DATA:  Hepatitis-C 15 years EXAM: ULTRASOUND ABDOMEN ULTRASOUND HEPATIC ELASTOGRAPHY TECHNIQUE: Sonography of the upper abdomen was performed. In addition, ultrasound elastography evaluation of the liver was performed. A region of interest was placed within the right lobe of the liver.  Following application of a compressive sonographic pulse, shear waves were detected in the adjacent hepatic tissue and the shear wave velocity was calculated. Multiple assessments were performed at the selected site. Median shear wave velocity is correlated to a Metavir fibrosis score. COMPARISON:  Ultrasound 05/30/2007 FINDINGS: ULTRASOUND ABDOMEN Gallbladder: No gallstones or wall thickening visualized. No sonographic Murphy sign noted by sonographer. Common bile duct: Diameter: Normal at 4 mm Liver: Heterogeneous echotexture with lobular contour. No focal lesion. Portal vein is patent on color Doppler imaging with normal direction of blood flow towards the liver. IVC: No abnormality visualized. Pancreas: Visualized portion unremarkable. Spleen: Size and appearance within normal limits. Right Kidney: Length: 9.6 cm. Echogenicity within normal limits. No mass or hydronephrosis visualized. Left Kidney: Length: 9.5 cm. Echogenicity within normal limits. No mass or hydronephrosis visualized. Abdominal aorta: No aneurysm visualized. Other findings: None. ULTRASOUND HEPATIC ELASTOGRAPHY Device: Siemens Helix VTQ Patient position: Left Lateral Decubitus Transducer 6C1 Number of measurements: 10 Hepatic segment:  8 Median velocity:   2.49  m/sec IQR: 2.43 IQR/Median velocity ratio: 0.26 Corresponding Metavir fibrosis score:  Some F3 + F4 Risk of fibrosis:  High Limitations of exam: None Pertinent findings noted on other imaging exams: Echogenic liver with lobular contour. Please note that abnormal shear wave velocities may also be identified in clinical settings other than with hepatic fibrosis, such as: acute hepatitis, elevated right heart and central venous pressures including use of beta blockers, veno-occlusive disease (Budd-Chiari), infiltrative processes such as mastocytosis/amyloidosis/infiltrative tumor, extrahepatic cholestasis, in the post-prandial state, and liver transplantation. Correlation with patient  history, laboratory data, and clinical condition recommended. IMPRESSION: ULTRASOUND ABDOMEN: 1. No acute abdominal findings. 2. Echogenic liver with lobular contour.  No focal lesion. ULTRASOUND HEPATIC ELASTOGRAPHY: Median hepatic shear wave velocity is calculated at 2.49 m/sec. Corresponding Metavir fibrosis score is  Some F3 + F4. Risk of fibrosis is Moderate. Follow-up: Follow up advised Electronically Signed   By: Suzy Bouchard M.D.   On: 09/22/2016 13:48    Speciality Comments: No specialty comments available.    Procedures:  No procedures performed Allergies: Hydrochlorothiazide and Stelara [ustekinumab]   Assessment / Plan:     Visit Diagnoses: Psoriatic arthropathy ([place]) she continues to have some arthralgias and discomfort in her right wrist and hand. She also complains of bilateral feet discomfort. There was no synovitis on examination today. She has synovial thickening. Overall she is doing better on otezla.  Psoriasis: Her psoriasis has improved.  Diarrhea: She's been experiencing diarrhea on otezla. We discussed use of Imodium on when necessary basis.  High risk medication use - On Otezla 30 mg po bid(07/31/2016) - Plan: CBC with Differential/Platelet, COMPLETE METABOLIC PANEL WITH GFR, CBC with Differential/Platelet, COMPLETE METABOLIC PANEL WITH GFR we'll check her labs today.  Chronic hepatitis C without hepatic coma [facility]): She is going to hepatitis clinic. When she is finished hepatitis C treatment and she may have other options for psoriatic arthritis.  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 He related gammopathy to hepatitis C.    Orders: Orders Placed This Encounter  Procedures  . CBC with Differential/Platelet  . COMPLETE METABOLIC PANEL WITH GFR   No orders of the defined types were placed in this encounter.     Follow-Up Instructions: Return in about 6 months (around 04/01/2017).   Bo Merino, MD  Note - This record has been  created using Editor, commissioning.  Chart creation errors have been sought, but may not always  have been located. Such creation errors do not reflect on  the standard of medical care.

## 2016-10-02 ENCOUNTER — Encounter: Payer: Self-pay | Admitting: Rheumatology

## 2016-10-02 ENCOUNTER — Ambulatory Visit (INDEPENDENT_AMBULATORY_CARE_PROVIDER_SITE_OTHER): Payer: Medicare Other | Admitting: Rheumatology

## 2016-10-02 VITALS — BP 135/77 | HR 80 | Resp 14 | Ht 63.0 in | Wt 167.0 lb

## 2016-10-02 DIAGNOSIS — L4050 Arthropathic psoriasis, unspecified: Secondary | ICD-10-CM | POA: Diagnosis not present

## 2016-10-02 DIAGNOSIS — B182 Chronic viral hepatitis C: Secondary | ICD-10-CM | POA: Diagnosis not present

## 2016-10-02 DIAGNOSIS — L409 Psoriasis, unspecified: Secondary | ICD-10-CM | POA: Diagnosis not present

## 2016-10-02 DIAGNOSIS — Z79899 Other long term (current) drug therapy: Secondary | ICD-10-CM | POA: Diagnosis not present

## 2016-10-02 DIAGNOSIS — D472 Monoclonal gammopathy: Secondary | ICD-10-CM

## 2016-10-02 LAB — COMPLETE METABOLIC PANEL WITH GFR
AG RATIO: 0.8 (calc) — AB (ref 1.0–2.5)
ALBUMIN MSPROF: 3.3 g/dL — AB (ref 3.6–5.1)
ALT: 46 U/L — ABNORMAL HIGH (ref 6–29)
AST: 84 U/L — ABNORMAL HIGH (ref 10–35)
Alkaline phosphatase (APISO): 112 U/L (ref 33–130)
BILIRUBIN TOTAL: 0.8 mg/dL (ref 0.2–1.2)
BUN: 9 mg/dL (ref 7–25)
CHLORIDE: 110 mmol/L (ref 98–110)
CO2: 26 mmol/L (ref 20–32)
Calcium: 9.7 mg/dL (ref 8.6–10.4)
Creat: 0.79 mg/dL (ref 0.60–0.93)
GFR, Est African American: 88 mL/min/{1.73_m2} (ref >=60)
GFR, Est Non African American: 76 mL/min/{1.73_m2} (ref >=60)
GLOBULIN: 4 g/dL — AB (ref 1.9–3.7)
Glucose, Bld: 103 mg/dL — ABNORMAL HIGH (ref 65–99)
Potassium: 3.8 mmol/L (ref 3.5–5.3)
SODIUM: 142 mmol/L (ref 135–146)
Total Protein: 7.3 g/dL (ref 6.1–8.1)

## 2016-10-02 LAB — CBC WITH DIFFERENTIAL/PLATELET
BASOS ABS: 53 {cells}/uL (ref 0–200)
Basophils Relative: 0.9 %
EOS ABS: 142 {cells}/uL (ref 15–500)
Eosinophils Relative: 2.4 %
HEMATOCRIT: 34.8 % — AB (ref 35.0–45.0)
HEMOGLOBIN: 12.1 g/dL (ref 11.7–15.5)
LYMPHS ABS: 1676 {cells}/uL (ref 850–3900)
MCH: 33.2 pg — AB (ref 27.0–33.0)
MCHC: 34.8 g/dL (ref 32.0–36.0)
MCV: 95.6 fL (ref 80.0–100.0)
MONOS PCT: 11.1 %
MPV: 12.3 fL (ref 7.5–12.5)
Neutro Abs: 3375 cells/uL (ref 1500–7800)
Neutrophils Relative %: 57.2 %
Platelets: 116 10*3/uL — ABNORMAL LOW (ref 140–400)
RBC: 3.64 10*6/uL — ABNORMAL LOW (ref 3.80–5.10)
RDW: 12.6 % (ref 11.0–15.0)
Total Lymphocyte: 28.4 %
WBC mixed population: 655 cells/uL (ref 200–950)
WBC: 5.9 10*3/uL (ref 3.8–10.8)

## 2016-10-02 LAB — SPECIMEN ID NOTIFICATION MISSING 2ND ID

## 2016-10-05 NOTE — Progress Notes (Signed)
LFTs are high. She is going to hepatitis clinic. Please, forward results to hepatitis clinic.

## 2016-12-01 ENCOUNTER — Telehealth: Payer: Self-pay

## 2016-12-01 NOTE — Telephone Encounter (Signed)
Received the provider portion along with patient's new insurance by fax from patient. Provider portion completed and faxed to [employer]. Called patient to verify that her portion was submitted. Patient states that she has not felt the best but she will complete her portion and fax it in.   We will update once we receive a response.   Will send documents to scan center.  Arnita Koons, [place], CPhT 12:59 PM

## 2016-12-02 ENCOUNTER — Other Ambulatory Visit: Payer: Self-pay | Admitting: Rheumatology

## 2016-12-03 NOTE — Telephone Encounter (Signed)
Last Visit: 10/02/16 Next Visit: 03/30/17  Okay to refill per Dr. Estanislado Pandy

## 2017-01-25 ENCOUNTER — Telehealth: Payer: Self-pay

## 2017-01-25 NOTE — Telephone Encounter (Signed)
Received a fax from [employer] regarding a prior authorization approval for otezla from 01/26/17 to 01/25/2018.   Reference number: [id number] Phone number: [phone]  Will send document to scan center.  Asha Grumbine, [place], CPhT   4:32 PM

## 2017-01-25 NOTE — Telephone Encounter (Signed)
Received notification from cover my meds that pts authorization will expire. Authorization has been submitted to pts insurance via cover my meds. Will update once we receive a response.   Emma Birchler 11:24 AM

## 2017-01-28 NOTE — Telephone Encounter (Signed)
Received a fax from [employer] regarding a prior authorization approval for Otezla from 01/26/2017 to 01/25/2018.   Reference number: [id number] Phone number:[phone]  Will send document to scan center.  Parry Po, [place], CPhT 10:54 AM

## 2017-02-26 ENCOUNTER — Telehealth: Payer: Self-pay | Admitting: Rheumatology

## 2017-02-26 DIAGNOSIS — G5601 Carpal tunnel syndrome, right upper limb: Secondary | ICD-10-CM

## 2017-02-26 NOTE — Telephone Encounter (Signed)
Patient states she was dx with carpal tunnel 20 years ago. Patient states it has not bothered her until recently. Patient states she is unable to do anything. Patient states she has been wearing a brace. Patient wants to know of any recommendations and if she should be referred to someone to have it looked at. Please advise.

## 2017-02-26 NOTE — Telephone Encounter (Signed)
Sch NCV

## 2017-02-26 NOTE — Telephone Encounter (Signed)
Referral has been placed. 

## 2017-02-26 NOTE — Telephone Encounter (Signed)
Patient was diagnosed with carpal tunnel syndrome over 20 years ago and it hasn't been an issue until recently.  She wanted to know if Dr. Estanislado Pandy could help her with the symptoms.  She is scheduled for an appointment in March, but wanted to see if she should be seen sooner with the new symptoms.

## 2017-03-02 ENCOUNTER — Ambulatory Visit: Payer: Medicare Other | Admitting: Hematology

## 2017-03-02 ENCOUNTER — Other Ambulatory Visit: Payer: Medicare Other

## 2017-03-03 ENCOUNTER — Other Ambulatory Visit: Payer: Self-pay | Admitting: Nurse Practitioner

## 2017-03-03 ENCOUNTER — Telehealth: Payer: Self-pay | Admitting: Rheumatology

## 2017-03-03 DIAGNOSIS — K7469 Other cirrhosis of liver: Secondary | ICD-10-CM

## 2017-03-03 NOTE — Telephone Encounter (Signed)
Bill, pharmacist from [facility] left a voicemail stating that they received a prescription for Otezla  1 tab/ twice daily and due to side effects it has been changed to once per day.  We need a new prescription to be faxed to [phone]

## 2017-03-05 MED ORDER — APREMILAST 30 MG PO TABS: 1.0000 | ORAL_TABLET | Freq: Every day | ORAL | 0 refills | Status: DC

## 2017-03-05 NOTE — Telephone Encounter (Signed)
Ok to send new prescription for [place] 1 tablet/daily.  She was experiencing diarrhea at her last visit in September 2018.

## 2017-03-05 NOTE — Telephone Encounter (Signed)
Prescription sent to the pharmacy.

## 2017-03-17 NOTE — Progress Notes (Deleted)
   Office Visit Note  Patient: Carol Horn             Date of Birth: 03/10/1945           MRN: [id number]             PCP: Chalmers Guest, [place] Referring: Chalmers Guest, FNP Visit Date: 03/30/2017 Occupation: @GUAROCC @    Subjective:  No chief complaint on file.   History of Present Illness: Carol Horn is a 72 y.o. female ***   Activities of Daily Living:  Patient reports morning stiffness for *** {minute/hour:19697}.   Patient {ACTIONS;DENIES/REPORTS:21021675::"Denies"} nocturnal pain.  Difficulty dressing/grooming: {ACTIONS;DENIES/REPORTS:21021675::"Denies"} Difficulty climbing stairs: {ACTIONS;DENIES/REPORTS:21021675::"Denies"} Difficulty getting out of chair: {ACTIONS;DENIES/REPORTS:21021675::"Denies"} Difficulty using hands for taps, buttons, cutlery, and/or writing: {ACTIONS;DENIES/REPORTS:21021675::"Denies"}   No Rheumatology ROS completed.   PMFS History:  Patient Active Problem List   Diagnosis Date Noted  . Thrombocytopenia ([place]) 07/27/2016  . Psoriasis 06/23/2016  . Psoriatic arthropathy ([place]) 06/23/2016  . High risk medication use 06/23/2016  . Chronic hepatitis C without hepatic coma ([place]) 06/23/2016    No past medical history on file.  No family history on file. No past surgical history on file. Social History   Social History Narrative  . Not on file     Objective: Vital Signs: There were no vitals taken for this visit.   Physical Exam   Musculoskeletal Exam: ***  CDAI Exam: No CDAI exam completed.    Investigation: No additional findings. CBC Latest Ref Rng & Units 10/02/2016 09/01/2016 06/23/2016  WBC 3.8 - 10.8 Thousand/uL 5.9 6.2 5.0  Hemoglobin 11.7 - 15.5 g/dL 12.1 13.0 13.1  Hematocrit 35.0 - 45.0 % 34.8(L) 38.2 38.4  Platelets 140 - 400 Thousand/uL 116(L) 160 157   CMP Latest Ref Rng & Units 10/02/2016 09/01/2016 09/01/2016  Glucose 65 - 99 mg/dL 103(H) 83 -  BUN 7 - 25 mg/dL 9 10.7 -  Creatinine 0.60 - 0.93 mg/dL 0.79  1.0 -  Sodium 135 - 146 mmol/L 142 142 -  Potassium 3.5 - 5.3 mmol/L 3.8 3.3(L) -  Chloride 98 - 110 mmol/L 110 - -  CO2 20 - 32 mmol/L 26 21(L) -  Calcium 8.6 - 10.4 mg/dL 9.7 9.7 -  Total Protein 6.1 - 8.1 g/dL 7.3 8.0 7.6  Total Bilirubin 0.2 - 1.2 mg/dL 0.8 1.45(H) -  Alkaline Phos 40 - 150 U/L - 112 -  AST 10 - 35 U/L 84(H) 75(H) -  ALT 6 - 29 U/L 46(H) 40 -    Imaging: No results found.  Speciality Comments: No specialty comments available.    Procedures:  No procedures performed Allergies: Hydrochlorothiazide and Stelara [ustekinumab]   Assessment / Plan:     Visit Diagnoses: No diagnosis found.    Orders: No orders of the defined types were placed in this encounter.  No orders of the defined types were placed in this encounter.   Face-to-face time spent with patient was *** minutes. 50% of time was spent in counseling and coordination of care.  Follow-Up Instructions: No Follow-up on file.   Earnestine Mealing, CMA  Note - This record has been created using Editor, commissioning.  Chart creation errors have been sought, but may not always  have been located. Such creation errors do not reflect on  the standard of medical care.

## 2017-03-18 NOTE — Progress Notes (Signed)
Office Visit Note  Patient: Carol Horn             Date of Birth: 1945-07-08           MRN: [id number]             PCP: Chalmers Guest, [place] Referring: Chalmers Guest, FNP Visit Date: 03/25/2017 Occupation: @GUAROCC @    Subjective:  Other (right arm pain )   History of Present Illness: Carol Horn is a 72 y.o. female with history of psoriatic arthritis and psoriasis.  She states she has been having increased pain and discomfort in her right wrist joint, right hand and bilateral knee joints.  She states that [place] N[place] has not been very effective.  She was taking 1 Otezla a day because of GI side effects.  She recently increase it to twice a day.  He has finished her hepatitis C treatment.  She will have repeat labs done by hepatitis clinic.  She continues to have some psoriasis on her knees.  Activities of Daily Living:  Patient reports morning stiffness for 1 hour.   Patient Reports nocturnal pain.  Difficulty dressing/grooming: Denies Difficulty climbing stairs: Denies Difficulty getting out of chair: Reports Difficulty using hands for taps, buttons, cutlery, and/or writing: Reports   Review of Systems  Constitutional: Negative for fatigue, night sweats, weight gain, weight loss and weakness.  HENT: Positive for mouth dryness. Negative for mouth sores, trouble swallowing, trouble swallowing and nose dryness.   Eyes: Positive for dryness. Negative for pain, redness and visual disturbance.  Respiratory: Negative for cough, shortness of breath and difficulty breathing.   Cardiovascular: Negative for chest pain, palpitations, hypertension, irregular heartbeat and swelling in legs/feet.  Gastrointestinal: Negative for blood in stool, constipation and diarrhea.  Endocrine: Negative for increased urination.  Genitourinary: Negative for vaginal dryness.  Musculoskeletal: Positive for arthralgias, joint pain, joint swelling and morning stiffness. Negative for myalgias, muscle  weakness, muscle tenderness and myalgias.  Skin: Positive for rash. Negative for color change, hair loss, skin tightness, ulcers and sensitivity to sunlight.       Psoriasis  Allergic/Immunologic: Negative for susceptible to infections.  Neurological: Negative for dizziness, memory loss and night sweats.  Hematological: Negative for swollen glands.  Psychiatric/Behavioral: Negative for depressed mood and sleep disturbance. The patient is not nervous/anxious.     PMFS History:  Patient Active Problem List   Diagnosis Date Noted  . Thrombocytopenia ([place]) 07/27/2016  . Psoriasis 06/23/2016  . Psoriatic arthropathy ([place]) 06/23/2016  . High risk medication use 06/23/2016  . Chronic hepatitis C without hepatic coma ([place]) 06/23/2016    History reviewed. No pertinent past medical history.  Family History  Problem Relation Age of Onset  . Alcohol abuse Mother   . Cirrhosis Mother   . Alcohol abuse Father   . Cirrhosis Father   . Heart attack Sister   . Celiac disease Daughter    Past Surgical History:  Procedure Laterality Date  . MOLE REMOVAL     on back    Social History   Social History Narrative  . Not on file     Objective: Vital Signs: BP (!) 154/80 (BP Location: Right Arm, Patient Position: Sitting, Cuff Size: Normal)   Pulse 69   Resp 16   Ht 5\' 2"  (1.575 m)   Wt 150 lb (68 kg)   BMI 27.44 kg/m    Physical Exam  Constitutional: She is oriented to person, place, and time. She appears well-developed and well-nourished.  HENT:  Head: Normocephalic and atraumatic.  Eyes: Conjunctivae and EOM are normal.  Neck: Normal range of motion.  Cardiovascular: Normal rate, regular rhythm, normal heart sounds and intact distal pulses.  Pulmonary/Chest: Effort normal and breath sounds normal.  Abdominal: Soft. Bowel sounds are normal.  Musculoskeletal:  Absence of left forearm  Lymphadenopathy:    She has no cervical adenopathy.  Neurological: She is alert and oriented to  person, place, and time.  Skin: Skin is warm and dry. Capillary refill takes less than 2 seconds.  Suspicious on her extremities especially her right knee.  Psychiatric: She has a normal mood and affect. Her behavior is normal.  Nursing note and vitals reviewed.    Musculoskeletal Exam: C-spine thoracic lumbar spine good range of motion.  She has some thoracic kyphosis.  Shoulder joints elbow joints are good range of motion.  She has severe synovitis over her right wrist joint and also most of her DIP joints.  She has warmth and swelling in her bilateral knee joints.  CDAI Exam: CDAI Homunculus Exam:   Tenderness:  RUE: wrist Right hand: 2nd DIP, 3rd DIP, 4th DIP and 5th DIP RLE: tibiofemoral LLE: tibiofemoral  Swelling:  RUE: wrist Right hand: 2nd DIP, 3rd DIP, 4th DIP and 5th DIP RLE: tibiofemoral LLE: tibiofemoral  Joint Counts:  CDAI Tender Joint count: 3 CDAI Swollen Joint count: 3  Global Assessments:  Patient Global Assessment: 6 Provider Global Assessment: 8  CDAI Calculated Score: 20    Investigation: No additional findings. CBC Latest Ref Rng & Units 10/02/2016 09/01/2016 06/23/2016  WBC 3.8 - 10.8 Thousand/uL 5.9 6.2 5.0  Hemoglobin 11.7 - 15.5 g/dL 12.1 13.0 13.1  Hematocrit 35.0 - 45.0 % 34.8(L) 38.2 38.4  Platelets 140 - 400 Thousand/uL 116(L) 160 157   CMP Latest Ref Rng & Units 10/02/2016 09/01/2016 09/01/2016  Glucose 65 - 99 mg/dL 103(H) 83 -  BUN 7 - 25 mg/dL 9 10.7 -  Creatinine 0.60 - 0.93 mg/dL 0.79 1.0 -  Sodium 135 - 146 mmol/L 142 142 -  Potassium 3.5 - 5.3 mmol/L 3.8 3.3(L) -  Chloride 98 - 110 mmol/L 110 - -  CO2 20 - 32 mmol/L 26 21(L) -  Calcium 8.6 - 10.4 mg/dL 9.7 9.7 -  Total Protein 6.1 - 8.1 g/dL 7.3 8.0 7.6  Total Bilirubin 0.2 - 1.2 mg/dL 0.8 1.45(H) -  Alkaline Phos 40 - 150 U/L - 112 -  AST 10 - 35 U/L 84(H) 75(H) -  ALT 6 - 29 U/L 46(H) 40 -    Imaging: No results found.  Speciality Comments: No specialty comments  available.    Procedures:  No procedures performed Allergies: Amoxicillin; Hydrochlorothiazide; and Stelara [ustekinumab]   Assessment / Plan:     Visit Diagnoses: Psoriatic arthropathy ([place]): She has severe psoriatic arthritis involving multiple joints with active synovitis.  She is unable to take any medication right now she is going through treatment for hepatitis C.  Her LFTs were still elevated.  She states she has appointment with hepatitis clinic in couple of weeks.  She will have repeat labs then.  Once we have clearance and repeat labs then we can start her on some Biologics.  She had an adequate response to a teslas 04.  We had detailed discussion regarding prednisone taper.  Indication side effects contraindications were discussed.  She was given prednisone 5 mg 4 tablets p.o. daily for 1 week and then taper by 5 mg every week until she  gets off the medication.  Psoriasis: She has active psoriasis patches on her extremities.  High risk medication use - Otezla 30 mg p.o. daily to twice daily.  Chronic hepatitis C without hepatic coma ([place]) - She is going to hepatitis clinic. When she is finished hepatitis C treatment and she may have other options for psoriatic arthritis.  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 He related gammopathy to hepatitis C.   Thrombocytopenia ([place])    Orders: No orders of the defined types were placed in this encounter.  Meds ordered this encounter  Medications  . predniSONE (DELTASONE) 5 MG tablet    Sig: Take 4 tablets by mouth daily for 1 week, then 3 tablets daily for 1 week, then 2 tablets daily for 1 week, then 1 tablet daily for 1 week.    Dispense:  70 tablet    Refill:  0    Face-to-face time spent with patient was 30 minutes.  Greater than 50% of time was spent in counseling and coordination of care.  Follow-Up Instructions: Return in about 3 months (around 06/22/2017) for Psoriatic arthritis, Psoriasis.   Bo Merino, MD  Note - This record has been created using Editor, commissioning.  Chart creation errors have been sought, but may not always  have been located. Such creation errors do not reflect on  the standard of medical care.

## 2017-03-23 ENCOUNTER — Encounter (INDEPENDENT_AMBULATORY_CARE_PROVIDER_SITE_OTHER): Payer: Self-pay | Admitting: Physical Medicine and Rehabilitation

## 2017-03-25 ENCOUNTER — Encounter: Payer: Self-pay | Admitting: Rheumatology

## 2017-03-25 ENCOUNTER — Ambulatory Visit (INDEPENDENT_AMBULATORY_CARE_PROVIDER_SITE_OTHER): Payer: Medicare Other | Admitting: Rheumatology

## 2017-03-25 VITALS — BP 154/80 | HR 69 | Resp 16 | Ht 62.0 in | Wt 150.0 lb

## 2017-03-25 DIAGNOSIS — B182 Chronic viral hepatitis C: Secondary | ICD-10-CM | POA: Diagnosis not present

## 2017-03-25 DIAGNOSIS — Z79899 Other long term (current) drug therapy: Secondary | ICD-10-CM

## 2017-03-25 DIAGNOSIS — D696 Thrombocytopenia, unspecified: Secondary | ICD-10-CM | POA: Diagnosis not present

## 2017-03-25 DIAGNOSIS — L4050 Arthropathic psoriasis, unspecified: Secondary | ICD-10-CM

## 2017-03-25 DIAGNOSIS — D472 Monoclonal gammopathy: Secondary | ICD-10-CM

## 2017-03-25 DIAGNOSIS — L409 Psoriasis, unspecified: Secondary | ICD-10-CM

## 2017-03-25 MED ORDER — PREDNISONE 5 MG PO TABS: ORAL_TABLET | ORAL | 0 refills | Status: DC

## 2017-03-30 ENCOUNTER — Ambulatory Visit: Payer: Medicare Other | Admitting: Rheumatology

## 2017-05-03 ENCOUNTER — Ambulatory Visit
Admission: RE | Admit: 2017-05-03 | Discharge: 2017-05-03 | Disposition: A | Discharge status: Home / Self Care | Payer: Medicare Other | Source: Ambulatory Visit | Attending: Nurse Practitioner | Admitting: Nurse Practitioner

## 2017-05-03 DIAGNOSIS — K7469 Other cirrhosis of liver: Secondary | ICD-10-CM

## 2017-05-26 HISTORY — PX: TYMPANOSTOMY TUBE PLACEMENT: SHX32

## 2017-05-28 ENCOUNTER — Other Ambulatory Visit: Payer: Self-pay | Admitting: Rheumatology

## 2017-05-28 MED ORDER — APREMILAST 30 MG PO TABS: 1.0000 | ORAL_TABLET | Freq: Every day | ORAL | 0 refills | Status: DC

## 2017-05-28 NOTE — Telephone Encounter (Signed)
Last Visit: 03/25/17 Next Visit: 06/23/17  Okay to refill per Dr. Estanislado Pandy

## 2017-05-28 NOTE — Telephone Encounter (Signed)
Patient called stating that she has a week left of her [place]uther[place] Nail and is needing a refill.  CB#[phone].  Thank you.

## 2017-05-31 ENCOUNTER — Telehealth: Payer: Self-pay | Admitting: Rheumatology

## 2017-05-31 NOTE — Telephone Encounter (Signed)
Patient called stating she has increased her Otezla medication dosage from 1 pill/day to 2 pills/day.  Patient states her prescription (Otezla 90 pills) was sent in last week to the specialty pharmacy and she is worried there are not enough pills for 3 months.

## 2017-06-01 NOTE — Telephone Encounter (Signed)
Patient states she had decreased on her [place] Nail to one tablet daily back in February 2019 due to side effects. Patient states she now would like to increase her dose back to two tablets per day because she is having break thorough pain. Please advise.

## 2017-06-01 NOTE — Telephone Encounter (Signed)
Ok to increase as tolerated.

## 2017-06-02 NOTE — Telephone Encounter (Signed)
Patient advised to increase as tolerated. Patient advised that since she is about to receive her medication in the mail to contact the office when medication runs out and we will send in new prescription with changes directions.

## 2017-06-09 NOTE — Progress Notes (Deleted)
Office Visit Note  Patient: Carol Horn             Date of Birth: Nov 27, 1945           MRN: [id number]             PCP: Chalmers Guest, [place] Referring: Chalmers Guest, FNP Visit Date: 06/23/2017 Occupation: @GUAROCC @    Subjective:  No chief complaint on file.   History of Present Illness: Carol Horn is a 72 y.o. female ***   Activities of Daily Living:  Patient reports morning stiffness for *** {minute/hour:19697}.   Patient {ACTIONS;DENIES/REPORTS:21021675::"Denies"} nocturnal pain.  Difficulty dressing/grooming: {ACTIONS;DENIES/REPORTS:21021675::"Denies"} Difficulty climbing stairs: {ACTIONS;DENIES/REPORTS:21021675::"Denies"} Difficulty getting out of chair: {ACTIONS;DENIES/REPORTS:21021675::"Denies"} Difficulty using hands for taps, buttons, cutlery, and/or writing: {ACTIONS;DENIES/REPORTS:21021675::"Denies"}   No Rheumatology ROS completed.   PMFS History:  Patient Active Problem List   Diagnosis Date Noted  . Thrombocytopenia ([place]) 07/27/2016  . Psoriasis 06/23/2016  . 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 High risk medication use 06/23/2016  . Chronic hepatitis C without hepatic coma ([place]) 06/23/2016    No past medical history on file.  Family History  Problem Relation Age of Onset  . Alcohol abuse Mother   . Cirrhosis Mother   . Alcohol abuse Father   . Cirrhosis Father   . Heart attack Sister   . Celiac disease Daughter    Past Surgical History:  Procedure Laterality Date  . MOLE REMOVAL     on back    Social History   Social History Narrative  . Not on file     Objective: Vital Signs: There were no vitals taken for this visit.   Physical Exam   Musculoskeletal Exam: ***  CDAI Exam: No CDAI exam completed.    Investigation: No additional findings. CBC Latest Ref Rng & Units 10/02/2016 09/01/2016 06/23/2016  WBC 3.8 - 10.8 Thousand/uL 5.9 6.2 5.0  Hemoglobin 11.7 - 15.5 g/dL 12.1 13.0 13.1  Hematocrit 35.0 - 45.0 %  34.8(L) 38.2 38.4  Platelets 140 - 400 Thousand/uL 116(L) 160 157   CMP Latest Ref Rng & Units 10/02/2016 09/01/2016 09/01/2016  Glucose 65 - 99 mg/dL 103(H) 83 -  BUN 7 - 25 mg/dL 9 10.7 -  Creatinine 0.60 - 0.93 mg/dL 0.79 1.0 -  Sodium 135 - 146 mmol/L 142 142 -  Potassium 3.5 - 5.3 mmol/L 3.8 3.3(L) -  Chloride 98 - 110 mmol/L 110 - -  CO2 20 - 32 mmol/L 26 21(L) -  Calcium 8.6 - 10.4 mg/dL 9.7 9.7 -  Total Protein 6.1 - 8.1 g/dL 7.3 8.0 7.6  Total Bilirubin 0.2 - 1.2 mg/dL 0.8 1.45(H) -  Alkaline Phos 40 - 150 U/L - 112 -  AST 10 - 35 U/L 84(H) 75(H) -  ALT 6 - 29 U/L 46(H) 40 -    Imaging: No results found.  Speciality Comments: No specialty comments available.    Procedures:  No procedures performed Allergies: Amoxicillin; Hydrochlorothiazide; and Stelara [ustekinumab]   Assessment / Plan:     Visit Diagnoses: Psoriatic arthropathy ([place])  Psoriasis  High risk medication use - Otezla 30 mg p.o. daily to twice daily.  Thrombocytopenia ([facility])  Chronic hepatitis C without hepatic coma ([place]) - She goes to the hepatitis clinic  Monoclonal gammopathy - She was evaluated by Dr. Irene Limbo    Orders: No orders of the defined types were placed in this encounter.  No orders of the defined types were placed in this encounter.   Face-to-face  time spent with patient was *** minutes. 50% of time was spent in counseling and coordination of care.  Follow-Up Instructions: No follow-ups on file.   Ofilia Neas, PA-C  Note - This record has been created using Dragon software.  Chart creation errors have been sought, but may not always  have been located. Such creation errors do not reflect on  the standard of medical care.

## 2017-06-22 NOTE — Progress Notes (Signed)
Office Visit Note  Patient: Carol Horn             Date of Birth: 03-07-1945           MRN: [id number]             PCP: Chalmers Guest, [place] Referring: Chalmers Guest, FNP Visit Date: 07/02/2017 Occupation: @GUAROCC @    Subjective:  Right wrist pain and swelling   History of Present Illness: Carol Horn is a 72 y.o. female with history of psoriatic arthritis.  Patient continues to take Otezla 30 mg BID.  She denies missing any doses recently.  She continues to have diarrhea as a side effect of Otezla.  She reports some days she has bouts of diarrhea that last a few hours.  She states she has tried taking [place] at different times a day but has not noticed any improvement.  She continues to have breakthrough pain in multiple joints. She has pain and swelling in the right wrist currently.  She states she also experiences bilateral SI joint pain at times.  She denies any achilles tendonitis or plantar fasciitis.  She states her psoriasis has not cleared, and she has a large patch on bilateral knee joints and right elbow joint.     Activities of Daily Living:  Patient reports morning stiffness for hours.   Patient Reports nocturnal pain.  Difficulty dressing/grooming: Denies Difficulty climbing stairs: Denies Difficulty getting out of chair: Reports Difficulty using hands for taps, buttons, cutlery, and/or writing: Reports   Review of Systems  Constitutional: Negative for fatigue.  HENT: Negative for mouth sores, mouth dryness and nose dryness.   Eyes: Positive for dryness. Negative for pain and visual disturbance.  Respiratory: Negative for cough, hemoptysis, shortness of breath and difficulty breathing.   Cardiovascular: Negative for chest pain, palpitations, hypertension and swelling in legs/feet.  Gastrointestinal: Positive for diarrhea. Negative for abdominal pain, blood in stool and constipation.  Endocrine: Negative for increased urination.  Genitourinary:  Negative for painful urination and pelvic pain.  Musculoskeletal: Positive for arthralgias, joint pain, joint swelling and morning stiffness. Negative for myalgias, muscle weakness, muscle tenderness and myalgias.  Skin: Negative for color change, pallor, rash, hair loss, nodules/bumps, skin tightness, ulcers and sensitivity to sunlight.  Allergic/Immunologic: Negative for susceptible to infections.  Neurological: Negative for dizziness and weakness.  Hematological: Negative for swollen glands.  Psychiatric/Behavioral: Negative for depressed mood, confusion and sleep disturbance. The patient is not nervous/anxious.     PMFS History:  Patient Active Problem List   Diagnosis Date Noted  . Thrombocytopenia ([place]) 07/27/2016  . Psoriasis 06/23/2016  . 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 High risk medication use 06/23/2016  . Chronic hepatitis C without hepatic coma ([place]) 06/23/2016    History reviewed. No pertinent past medical history.  Family History  Problem Relation Age of Onset  . Alcohol abuse Mother   . Cirrhosis Mother   . Alcohol abuse Father   . Cirrhosis Father   . Heart attack Sister   . Celiac disease Daughter    Past Surgical History:  Procedure Laterality Date  . EYE SURGERY Bilateral 04/2017 05/2017   cataract extraction   . MOLE REMOVAL     on back   . TYMPANOSTOMY TUBE PLACEMENT Right 05/2017   Social History   Social History Narrative  . Not on file     Objective: Vital Signs: BP (!) 154/79 (BP Location: Right Arm, Patient Position: Sitting, Cuff Size: Normal)   Pulse Marland Kitchen)  56   Resp 17   Ht 5\' 2"  (1.575 m)   Wt 151 lb (68.5 kg)   BMI 27.62 kg/m    Physical Exam  Constitutional: She is oriented to person, place, and time. She appears well-developed and well-nourished.  HENT:  Head: Normocephalic and atraumatic.  Eyes: Conjunctivae and EOM are normal.  Neck: Normal range of motion.  Cardiovascular: Normal rate, regular rhythm, normal heart  sounds and intact distal pulses.  Pulmonary/Chest: Effort normal and breath sounds normal.  Abdominal: Soft. Bowel sounds are normal.  Lymphadenopathy:    She has no cervical adenopathy.  Neurological: She is alert and oriented to person, place, and time.  Skin: Skin is warm and dry. Capillary refill takes less than 2 seconds.  Psoriasis patches on extensor surfaces of bilateral knee joints and right elbow joint.  Fingernail pitting noted.   Psychiatric: She has a normal mood and affect. Her behavior is normal.  Nursing note and vitals reviewed.    Musculoskeletal Exam: C-spine, thoracic spine, and lumbar spine good ROM. 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  Bilateral shoulder full ROM. Right elbow good ROM.  Synovitis of right wrist.  PIP and DIP synovial thickening.  She has tenderness of the right 1st DIP joint.  Hip joints, knee joints, ankle joints, MTPs, PIPs and DIPs good ROM with no synovitis.  No warmth or effusion of knee joints.  Tenderness of bilateral trochanteric bursa.   CDAI Exam: No CDAI exam completed.    Investigation: No additional findings.   Imaging: No results found.  Speciality Comments: No specialty comments available.    Procedures:  No procedures performed Allergies: Amoxicillin; Hydrochlorothiazide; and Stelara [ustekinumab]   Assessment / Plan:     Visit Diagnoses: Psoriatic arthropathy ([place]): She has synovitis of the right wrist, right 1st, 2nd, and 3rd DIP joints. She has not noticed any skin clearance of her psoriasis while on [place].  She continues to have breakthrough joint pain in multiple joints.  She has been taking Otezla 30 mg BID. She has been experiencing severe diarrhea intermittently due to [place].   She previously was on Enbrel in the past but discontinued due to it not being effective over time. She was cleared to restart on biologics by her [facility] liver care specialist Roosevelt Locks, NP. She was treated for hepatitis C at  the hepatitis clinic. She has been doing her own research and she would like to try Cosentyx.  Indications, contraindications, and side effects were discussed. We will apply for Cosentyx.  Consent was obtained today.    Medication counseling: TB Gold: Pending (Negative 06/23/16) Hepatitis panel: Tx at hepatitis clinic and was cleared to start biologic  HIV: Non-reactive 06/23/16 SPEP: 06/23/16 Immunoglobulin: 06/23/16  Does patient have a history of inflammatory bowel disease? No  Counseled patient that Cosentyx is a IL-17 inhibitor that works to reduce pain and inflammation associated with arthritis.  Counseled patient on purpose, proper use, and adverse effects of Cosentyx. Reviewed the most common adverse effects of infection, inflammatory bowel disease, and allergic reaction.  Reviewed the importance of regular labs while on Cosentyx.  Counseled patient that Cosentyx should be held prior to scheduled surgery.  Counseled patient to avoid live vaccines while on Cosentyx.  Advised patient to get annual influenza vaccine and the pneumococcal vaccine as indicated.  Provided patient with medication education material and answered all questions.  Patient consented to Cosentyx.  Will upload consent into patient's chart.  Will apply  for Cosentyx through patient's insurance.  Reviewed storage information for Cosentyx.  Advised initial injection must be administered in office.  Patient voiced understanding.    Psoriasis: She has large plaque psoriasis on extensor surfaces of bilateral knee joints and right elbow joint.  She does not feel like Ruther[place] Nail is helping her psoriasis.  She has been using triamcinolone cream topically.   High risk medication use - We will discontinue Otezla and apply for Cosentyx.    Monoclonal gammopathy - - Patient was evaluated by Dr.Kale. He related gammopathy to hepatitis C.   Chronic hepatitis C without hepatic coma ([place]) - She is going to hepatitis clinic. When she is finished  hepatitis C treatment and she may have other options for psoriatic arthritis.   Thrombocytopenia ([place])    Orders: Orders Placed This Encounter  Procedures  . QuantiFERON-TB Gold Plus  . CBC with Differential/Platelet  . COMPLETE METABOLIC PANEL WITH GFR   No orders of the defined types were placed in this encounter.   Face-to-face time spent with patient was 30 minutes. >50% of time was spent in counseling and coordination of care.  Follow-Up Instructions: Return in about 3 months (around 10/02/2017) for Psoriatic arthritis.   Ofilia Neas, PA-C   I examined and evaluated the patient with Hazel Sams PA.  Patient was having flare of psoriasis and psoriatic arthritis on Otezla on my exam.  We discussed different treatment options.  She has failed Enbrel in the past.  We will apply for Cosentyx.  Informed consent was obtained today.  The plan of care was discussed as noted above.  Bo Merino, MD  Note - This record has been created using Editor, commissioning.  Chart creation errors have been sought, but may not always  have been located. Such creation errors do not reflect on  the standard of medical care.

## 2017-06-23 ENCOUNTER — Ambulatory Visit: Payer: Medicare Other | Admitting: Physician Assistant

## 2017-07-02 ENCOUNTER — Encounter: Payer: Self-pay | Admitting: Rheumatology

## 2017-07-02 ENCOUNTER — Telehealth: Payer: Self-pay | Admitting: *Deleted

## 2017-07-02 ENCOUNTER — Ambulatory Visit: Payer: Medicare Other | Admitting: Rheumatology

## 2017-07-02 VITALS — BP 154/79 | HR 56 | Resp 17 | Ht 62.0 in | Wt 151.0 lb

## 2017-07-02 DIAGNOSIS — B182 Chronic viral hepatitis C: Secondary | ICD-10-CM

## 2017-07-02 DIAGNOSIS — L4050 Arthropathic psoriasis, unspecified: Secondary | ICD-10-CM | POA: Diagnosis not present

## 2017-07-02 DIAGNOSIS — L409 Psoriasis, unspecified: Secondary | ICD-10-CM | POA: Diagnosis not present

## 2017-07-02 DIAGNOSIS — Z79899 Other long term (current) drug therapy: Secondary | ICD-10-CM | POA: Diagnosis not present

## 2017-07-02 DIAGNOSIS — D472 Monoclonal gammopathy: Secondary | ICD-10-CM | POA: Diagnosis not present

## 2017-07-02 DIAGNOSIS — D696 Thrombocytopenia, unspecified: Secondary | ICD-10-CM

## 2017-07-02 NOTE — Telephone Encounter (Signed)
Prior Authorization for Cosentyx submitted by cover my meds. Will update when decision is given.

## 2017-07-02 NOTE — Patient Instructions (Addendum)
Secukinumab injection What is this medicine? SECUKINUMAB (sek ue KIN ue mab) is used to treat psoriasis. It is also used to treat psoriatic arthritis and ankylosing spondylitis. This medicine may be used for other purposes; ask your health care provider or pharmacist if you have questions. COMMON BRAND NAME(S): Cosentyx What should I tell my health care provider before I take this medicine? They need to know if you have any of these conditions: -Crohn's disease, ulcerative colitis, or other inflammatory bowel disease -infection or history of infection -other conditions affecting the immune system -recently received or are scheduled to receive a vaccine -tuberculosis, a positive skin test for tuberculosis, or have recently been in close contact with someone who has tuberculosis -an unusual or allergic reaction to secukinumab, other medicines, latex, rubber, foods, dyes, or preservatives -pregnant or trying to get pregnant -breast-feeding How should I use this medicine? This medicine is for injection under the skin. It may be administered by a healthcare professional in a hospital or clinic setting or at home. If you get this medicine at home, you will be taught how to prepare and give this medicine. Use exactly as directed. Take your medicine at regular intervals. Do not take your medicine more often than directed. It is important that you put your used needles and syringes in a special sharps container. Do not put them in a trash can. If you do not have a sharps container, call your pharmacist or healthcare provider to get one. A special MedGuide will be given to you by the pharmacist with each prescription and refill. Be sure to read this information carefully each time. Talk to your pediatrician regarding the use of this medicine in children. Special care may be needed. Overdosage: If you think you have taken too much of this medicine contact a poison control center or emergency room at  once. NOTE: This medicine is only for you. Do not share this medicine with others. What if I miss a dose? It is important not to miss your dose. Call your doctor of health care professional if you are unable to keep an appointment. If you give yourself the medicine and you miss a dose, take it as soon as you can. If it is almost time for your next dose, take only that dose. Do not take double or extra doses. What may interact with this medicine? Do not take this medicine with any of the following medications: -live virus vaccines This medicine may also interact with the following medications: -cyclosporine -inactivated vaccines -warfarin This list may not describe all possible interactions. Give your health care provider a list of all the medicines, herbs, non-prescription drugs, or dietary supplements you use. Also tell them if you smoke, drink alcohol, or use illegal drugs. Some items may interact with your medicine. What should I watch for while using this medicine? Tell your doctor or healthcare professional if your symptoms do not start to get better or if they get worse. You will be tested for tuberculosis (TB) before you start this medicine. If your doctor prescribes any medicine for TB, you should start taking the TB medicine before starting this medicine. Make sure to finish the full course of TB medicine. Call your doctor or healthcare professional for advice if you get a fever, chills or sore throat, or other symptoms of a cold or flu. Do not treat yourself. This drug decreases your body's ability to fight infections. Try to avoid being around people who are sick. This medicine can decrease   the response to a vaccine. If you need to get vaccinated, tell your healthcare professional if you have received this medicine within the last 6 months. Extra booster doses may be needed. Talk to your doctor to see if a different vaccination schedule is needed. What side effects may I notice from  receiving this medicine? Side effects that you should report to your doctor or health care professional as soon as possible: -allergic reactions like skin rash, itching or hives, swelling of the face, lips, or tongue -signs and symptoms of infection like fever or chills; cough; sore throat; pain or trouble passing urine Side effects that usually do not require medical attention (report to your doctor or health care professional if they continue or are bothersome): -diarrhea This list may not describe all possible side effects. Call your doctor for medical advice about side effects. You may report side effects to FDA at 1-800-FDA-1088. Where should I keep my medicine? Keep out of the reach of children. Store the prefilled syringe or injection pen in a refrigerator between 2 to 8 degrees C (36 to 46 degrees F). Keep the syringe or the pen in the original carton until ready for use. Protect from light. Do not freeze. Do not shake. Prior to use, remove the syringe or pen from the refrigerator and use within 1 hour. Throw away any unused medicine after the expiration date on the label. NOTE: This sheet is a summary. It may not cover all possible information. If you have questions about this medicine, talk to your doctor, pharmacist, or health care provider.  2018 Elsevier/Gold Standard (2015-02-14 11:48:31)   Trochanteric Bursitis Rehab Ask your health care provider which exercises are safe for you. Do exercises exactly as told by your health care provider and adjust them as directed. It is normal to feel mild stretching, pulling, tightness, or discomfort as you do these exercises, but you should stop right away if you feel sudden pain or your pain gets worse.Do not begin these exercises until told by your health care provider. Stretching exercises These exercises warm up your muscles and joints and improve the movement and flexibility of your hip. These exercises also help to relieve pain and  stiffness. Exercise A: Iliotibial band stretch  1. Lie on your side with your left / right leg in the top position. 2. Bend your left / right knee and grab your ankle. 3. Slowly bring your knee back so your thigh is behind your body. 4. Slowly lower your knee toward the floor until you feel a gentle stretch on the outside of your left / right thigh. If you do not feel a stretch and your knee will not fall farther, place the heel of your other foot on top of your outer knee and pull your thigh down farther. 5. Hold this position for __________ seconds. 6. Slowly return to the starting position. Repeat __________ times. Complete this exercise __________ times a day. Strengthening exercises These exercises build strength and endurance in your hip and pelvis. Endurance is the ability to use your muscles for a long time, even after they get tired. Exercise B: Bridge ( hip extensors) 1. Lie on your back on a firm surface with your knees bent and your feet flat on the floor. 2. Tighten your buttocks muscles and lift your buttocks off the floor until your trunk is level with your thighs. You should feel the muscles working in your buttocks and the back of your thighs. If this exercise is too  easy, try doing it with your arms crossed over your chest. 3. Hold this position for __________ seconds. 4. Slowly return to the starting position. 5. Let your muscles relax completely between repetitions. Repeat __________ times. Complete this exercise __________ times a day. Exercise C: Squats ( knee extensors and  quadriceps) 1. Stand in front of a table, with your feet and knees pointing straight ahead. You may rest your hands on the table for balance but not for support. 2. Slowly bend your knees and lower your hips like you are going to sit in a chair. ? Keep your weight over your heels, not over your toes. ? Keep your lower legs upright so they are parallel with the table legs. ? Do not let your hips go  lower than your knees. ? Do not bend lower than told by your health care provider. ? If your hip pain increases, do not bend as low. 3. Hold this position for __________ seconds. 4. Slowly push with your legs to return to standing. Do not use your hands to pull yourself to standing. Repeat __________ times. Complete this exercise __________ times a day. Exercise D: Hip hike 1. Stand sideways on a bottom step. Stand on your left / right leg with your other foot unsupported next to the step. You can hold onto the railing or wall if needed for balance. 2. Keeping your knees straight and your torso square, lift your left / right hip up toward the ceiling. 3. Hold this position for __________ seconds. 4. Slowly let your left / right hip lower toward the floor, past the starting position. Your foot should get closer to the floor. Do not lean or bend your knees. Repeat __________ times. Complete this exercise __________ times a day. Exercise E: Single leg stand 1. Stand near a counter or door frame that you can hold onto for balance as needed. It is helpful to stand in front of a mirror for this exercise so you can watch your hip. 2. Squeeze your left / right buttock muscles then lift up your other foot. Do not let your left / right hip push out to the side. 3. Hold this position for __________ seconds. Repeat __________ times. Complete this exercise __________ times a day. This information is not intended to replace advice given to you by your health care provider. Make sure you discuss any questions you have with your health care provider. Document Released: 02/20/2004 Document Revised: 09/19/2015 Document Reviewed: 12/28/2014 Elsevier Interactive Patient Education  [employer].

## 2017-07-04 LAB — CBC WITH DIFFERENTIAL/PLATELET
BASOS ABS: 63 {cells}/uL (ref 0–200)
BASOS PCT: 1 %
EOS ABS: 158 {cells}/uL (ref 15–500)
Eosinophils Relative: 2.5 %
HEMATOCRIT: 36.1 % (ref 35.0–45.0)
HEMOGLOBIN: 12.6 g/dL (ref 11.7–15.5)
Lymphs Abs: 1707 cells/uL (ref 850–3900)
MCH: 33 pg (ref 27.0–33.0)
MCHC: 34.9 g/dL (ref 32.0–36.0)
MCV: 94.5 fL (ref 80.0–100.0)
MONOS PCT: 10.2 %
MPV: 11.3 fL (ref 7.5–12.5)
NEUTROS ABS: 3730 {cells}/uL (ref 1500–7800)
Neutrophils Relative %: 59.2 %
Platelets: 159 10*3/uL (ref 140–400)
RBC: 3.82 10*6/uL (ref 3.80–5.10)
RDW: 13 % (ref 11.0–15.0)
Total Lymphocyte: 27.1 %
WBC: 6.3 10*3/uL (ref 3.8–10.8)
WBCMIX: 643 {cells}/uL (ref 200–950)

## 2017-07-04 LAB — COMPLETE METABOLIC PANEL WITH GFR
AG RATIO: 1.1 (calc) (ref 1.0–2.5)
ALT: 18 U/L (ref 6–29)
AST: 36 U/L — ABNORMAL HIGH (ref 10–35)
Albumin: 3.6 g/dL (ref 3.6–5.1)
Alkaline phosphatase (APISO): 98 U/L (ref 33–130)
BILIRUBIN TOTAL: 0.9 mg/dL (ref 0.2–1.2)
BUN: 17 mg/dL (ref 7–25)
CHLORIDE: 111 mmol/L — AB (ref 98–110)
CO2: 22 mmol/L (ref 20–32)
Calcium: 9.5 mg/dL (ref 8.6–10.4)
Creat: 0.82 mg/dL (ref 0.60–0.93)
GFR, EST AFRICAN AMERICAN: 83 mL/min/{1.73_m2} (ref >=60)
GFR, Est Non African American: 72 mL/min/{1.73_m2} (ref >=60)
Globulin: 3.3 g/dL (calc) (ref 1.9–3.7)
Glucose, Bld: 88 mg/dL (ref 65–99)
POTASSIUM: 4 mmol/L (ref 3.5–5.3)
SODIUM: 142 mmol/L (ref 135–146)
TOTAL PROTEIN: 6.9 g/dL (ref 6.1–8.1)

## 2017-07-04 LAB — QUANTIFERON-TB GOLD PLUS
NIL: 0.02 IU/mL
QuantiFERON-TB Gold Plus: NEGATIVE
TB1-NIL: 0 IU/mL
TB2-NIL: 0.01 [IU]/mL

## 2017-07-05 NOTE — Progress Notes (Signed)
Tb gold negative. CBC WNL. ALT WNL. AST trending down.

## 2017-07-07 ENCOUNTER — Telehealth: Payer: Self-pay | Admitting: Rheumatology

## 2017-07-07 ENCOUNTER — Other Ambulatory Visit: Payer: Self-pay | Admitting: *Deleted

## 2017-07-07 MED ORDER — SECUKINUMAB 150 MG/ML ~~LOC~~ SOAJ: 300.0000 mg | SUBCUTANEOUS | 0 refills | Status: DC

## 2017-07-07 NOTE — Telephone Encounter (Signed)
Cosentyx has been approved an patient advised.Prescription sent to the pharmacy Patient advised we will set up a nurse visit once the medication is received in the office.

## 2017-07-07 NOTE — Telephone Encounter (Signed)
Received approval for prior authorization on Cosentyx. Approved from 01/26/17-01/25/18. Prescription sent to [facility] out patient pharmacy.   Attempted to contact the patient and left message for patent to call the office.

## 2017-07-07 NOTE — Telephone Encounter (Signed)
Carol Horn from [facility] called stating they received the prescription for the Cosentyx loading doses.  It did go through her insurance, however, it is a [phone] copay.  We are not sure how you want to handle this or how we can help.  Please call us back at [phone]

## 2017-07-14 NOTE — Telephone Encounter (Signed)
Patient has been provided with patient assistance information and phone number. Patient will call to see if she qualifies. Contacted pharmacy to advise and will update them once we have a response.

## 2017-07-21 ENCOUNTER — Telehealth: Payer: Self-pay | Admitting: Rheumatology

## 2017-07-21 NOTE — Telephone Encounter (Signed)
Called and advised Carol Horn patient will receive medication through patient assistance program.  [employer] and they are awaiting a prescription. They will fax over the paper work to be filled out.

## 2017-07-21 NOTE — Telephone Encounter (Signed)
Carol Horn from [facility] left a voicemail stating they saw correspondence on 07/14/17 that the office is working on patient assistance for the patient.  They are checking to see if anything has changed. [phone]

## 2017-08-06 ENCOUNTER — Telehealth: Payer: Self-pay | Admitting: Rheumatology

## 2017-08-06 NOTE — Telephone Encounter (Signed)
Patient called stating she tried to fax her W2 information and application for [employer].  Patient states she kept getting an error message and is checking to see if we received the fax.  Patient states she will try to fax it again this afternoon or bring it to the office herself.  Patient is requesting a return call to let her know if you received her information.

## 2017-08-06 NOTE — Telephone Encounter (Signed)
Patient advised we have received fax and we have sent to the information to [employer].

## 2017-08-10 ENCOUNTER — Telehealth: Payer: Self-pay | Admitting: Rheumatology

## 2017-08-10 NOTE — Telephone Encounter (Signed)
Carol Horn from [employer] patient assistance foundation left a voicemail stating they received the patient's application for enrollment into our program for medication assistance for Cosentyx.  However, the doctor's portion of the application or page 4 out of 13 of the application is missing a signature.  The lower most line is missing the doctor's signature which is consent for our program.  The top line which was signed validates the prescription.  Please resend the application to [place] with both signatures.  If you have any questions, please call # [phone]

## 2017-08-11 NOTE — Telephone Encounter (Signed)
Re-faxed form with signatures.

## 2017-08-13 ENCOUNTER — Telehealth: Payer: Self-pay

## 2017-08-13 NOTE — Telephone Encounter (Signed)
Medication Samples have been provided to the patient.  Drug name: Rutherford Nail       Strength: 30mg         Qty: 28 tabs  LOT: [id number]  Exp.Date: 08/2017  Dosing instructions: Take 1 tablet by mouth 2 times daily.   The patient has been instructed regarding the correct time, dose, and frequency of taking this medication, including desired effects and most common side effects.   Earnestine Mealing 4:35 PM 08/13/2017

## 2017-08-18 ENCOUNTER — Telehealth: Payer: Self-pay | Admitting: *Deleted

## 2017-08-18 NOTE — Telephone Encounter (Signed)
Attempted to contact the patient and left message for patient to call the office. Patient has been approved for patient assistance through [employer].

## 2017-08-19 ENCOUNTER — Telehealth: Payer: Self-pay | Admitting: Rheumatology

## 2017-08-19 NOTE — Telephone Encounter (Signed)
Patient left a voicemail stating she was returning your call.   

## 2017-08-19 NOTE — Telephone Encounter (Signed)
Patient advised we have received approval from [employer] that she was approved for the patient assistance to receive Cosentyx. Patient will contact them for shipment of medication. Then will set up nurse visit. patient will advise [place] once she has set up shipment so when may coordinate her nurse visit. Patient is currently on Otelza and she knows she will need to be off of it for 1 week prior to starting Cosentyx. Patient would like to know id she can have prednisone during that time. She states she does not know if she will be able to go without anything for that time. Please advise.

## 2017-08-19 NOTE — Telephone Encounter (Signed)
We can give her a prednisone taper until she transitions to Cosentyx.  She can start on prednisone 20 mg p.o. daily and taper by 5 mg p.o. Weekly.

## 2017-08-20 MED ORDER — PREDNISONE 5 MG PO TABS: ORAL_TABLET | ORAL | 0 refills | Status: DC

## 2017-08-20 NOTE — Telephone Encounter (Signed)
Attempted to contact patient and left message for patient to call the office.  

## 2017-08-23 ENCOUNTER — Telehealth: Payer: Self-pay | Admitting: Rheumatology

## 2017-08-23 NOTE — Telephone Encounter (Signed)
Spoke with patient and she sates she has called [employer]. Patient states they advised her they are waiting on a calcification on her prescription. Patient advised this was faxed back last week. Patient advised that if they faxed another form I would be more than happy to fill it out again and fax it back so we may get her prescription here to the office.

## 2017-08-23 NOTE — Telephone Encounter (Signed)
Patient would like to know if a rx for Prednisone can be sent into [facility] in [place]. Patient has taken herself off the [place], and is having a lot of increased pain. Patient also would like to know if her medication has come in yet. Please call to advise.

## 2017-08-23 NOTE — Telephone Encounter (Signed)
Patient advised that the prescription for Prednisone has been sent to the pharmacy. Patient states she has received a text from [employer] to advise her prescription has been processed. Patient states she will contact them today to find out when they will ship the medication so we may set up her nursing visit. Patient had her last dose of Otezla on 08/19/17.

## 2017-08-23 NOTE — Telephone Encounter (Signed)
Patient left a message requesting a call from [place].

## 2017-08-25 ENCOUNTER — Telehealth: Payer: Self-pay | Admitting: Rheumatology

## 2017-08-25 NOTE — Telephone Encounter (Signed)
Carol Horn from [employer] patient assistance called stating that patient's prescription of Cosentyx will be delivered to our office on Friday 08/27/17 between 8:00 am and 5:00 pm.

## 2017-08-26 ENCOUNTER — Other Ambulatory Visit: Payer: Self-pay | Admitting: Rheumatology

## 2017-08-26 NOTE — Telephone Encounter (Signed)
Once medication is received in the office, we will schedule nurse visit.

## 2017-08-27 NOTE — Telephone Encounter (Signed)
Cosentyx has been delivered and placed in the fridge. Please call patient to schedule nurse visit.  Thanks!  2:48 PM  Beatriz Chancellor, CPhT

## 2017-08-30 ENCOUNTER — Telehealth: Payer: Self-pay | Admitting: Rheumatology

## 2017-08-30 NOTE — Telephone Encounter (Signed)
Patient states she only took the first week of the prednisone taper due to upset stomach, unable to sleep and constipation. Patient states she could not tolerate it.  I advised patient once Seth Bake is back in the office tomorrow, she would call to schedule nurse visit to start cosentyx.

## 2017-08-30 NOTE — Telephone Encounter (Signed)
Returned patient's call and advised patient of information below. Patient does not want to restart the prednisone and agreed to take OTC NSAIDs. I advised patient to avoid tylenol and she verbalized understanding.

## 2017-08-30 NOTE — Telephone Encounter (Signed)
Patient called stating she stopped taking the Prednisone yesterday because it upset her stomach and was unable to sleep.  Patient states she has never had this problem before.  Patient is also requesting a call to schedule her Cosentyx injection.

## 2017-08-30 NOTE — Telephone Encounter (Signed)
Please ask patient if she is taking prednisone at breakfast with food.  If she cannot tolerate prednisone 20 mg, she can try Prednisone 10 mg daily for 1 week then decrease to 7.5 mg for 1 week, 5 mg for 1 week, then 2.5 mg for 1 wk until Cosentyx begins to work.  If she is apprehensive to restart on Prednisone she can try taking OTC NSAIDs.  Please advise patient to avoid tylenol due to history of elevated LFTs.  Please schedule appointment for nurse visit to start the patient on Cosentyx ASAP.

## 2017-08-31 NOTE — Telephone Encounter (Signed)
Called patient to schedule nurse visit. She is scheduled for 09/02/2017 @ 9:00am.

## 2017-09-02 ENCOUNTER — Ambulatory Visit: Payer: Medicare Other | Admitting: *Deleted

## 2017-09-02 VITALS — BP 165/72 | HR 62

## 2017-09-02 DIAGNOSIS — L4050 Arthropathic psoriasis, unspecified: Secondary | ICD-10-CM | POA: Diagnosis not present

## 2017-09-02 DIAGNOSIS — L409 Psoriasis, unspecified: Secondary | ICD-10-CM

## 2017-09-02 MED ORDER — SECUKINUMAB 150 MG/ML ~~LOC~~ SOAJ
300.0000 mg | Freq: Once | SUBCUTANEOUS | Status: AC
  Administered 2017-09-02: 300 mg via SUBCUTANEOUS

## 2017-09-02 NOTE — Patient Instructions (Signed)
Standing Labs We placed an order today for your standing lab work.    Please come back and get your standing labs in 1 month and every 3 months  We have open lab Monday through Friday from 8:30-11:30 AM and 1:30-4:00 PM  at the office of Dr. Shaili Deveshwar.   You may experience shorter wait times on Monday and Friday afternoons. The office is located at [address], [address], [place], [place] [address] No appointment is necessary.   Labs are drawn by [facility].  You may receive a bill from [facility] for your lab work. If you have any questions regarding directions or hours of operation,  please call [phone].     

## 2017-09-02 NOTE — Progress Notes (Signed)
Patient in office for a new start to Cosentyx. Patient is switching from [place] to Cosentyx. Patient states she last had her Otezla 2 weeks ago. Patient was given a demonstration on the proper technique on how to self administer medication. Patient was able to demonstrate proper technique. Patient was given injections in her right and left thighs and tolerated well.   Administrations This Visit    Secukinumab SOAJ 300 mg    Admin Date 09/02/2017 Action Given Dose 300 mg Route Subcutaneous Administered By Carole Binning, LPN

## 2017-09-03 ENCOUNTER — Telehealth: Payer: Self-pay | Admitting: Rheumatology

## 2017-09-03 NOTE — Telephone Encounter (Signed)
Attempted to call patient and left message on machine to advise patient she will be due for next injection on Thursday. Advised patient she will be due every Thursday for the next 4 weeks.

## 2017-09-03 NOTE — Telephone Encounter (Signed)
Patient calling to find out when she is due to take her next Cosentyx dose. Per patient, she was told, but she does not remember. Please call to advise.

## 2017-10-01 ENCOUNTER — Telehealth: Payer: Self-pay | Admitting: Rheumatology

## 2017-10-01 DIAGNOSIS — Z79899 Other long term (current) drug therapy: Secondary | ICD-10-CM

## 2017-10-01 NOTE — Telephone Encounter (Signed)
Patient advised she is due for labs as she started Cosentyx a month ago. Patient will come 10/04/17 for labs. Patient states she took her last loading injection 09/30/17. Patient advised she is not due for her next injection for 1 month and we will send prescription to the pharmacy after labs. Patient verbalized understanding.

## 2017-10-01 NOTE — Progress Notes (Signed)
Office Visit Note  Patient: Carol Horn             Date of Birth: 11/22/1945           MRN: [id number]             PCP: Chalmers Guest, [place] Referring: Chalmers Guest, FNP Visit Date: 10/15/2017 Occupation: @GUAROCC @  Subjective:  Pain in multiple joints   History of Present Illness: Carol Horn is a 72 y.o. female with history of psoriatic arthritis.  Patient is on Cosentyx subcutaneous injections. She was started on 09/02/17.  She hs noticed benefit since starting.  She continues to have pain in multiple joints as well as psoriasis on the extensor surfaces of both knee joints.  She is having right wrist pain, SI joint pain bilaterally, and bilateral knee pain.  She continues to have bilateral carpal tunnel syndrome. She denies plantar fasciitis or achilles tendonitis.   Activities of Daily Living:  Patient reports morning stiffness for several hours.   Patient Reports nocturnal pain.  Difficulty dressing/grooming: Reports Difficulty climbing stairs: Reports Difficulty getting out of chair: Reports Difficulty using hands for taps, buttons, cutlery, and/or writing: Reports  Review of Systems  Constitutional: Positive for fatigue.  HENT: Negative for mouth sores, trouble swallowing, trouble swallowing, mouth dryness and nose dryness.   Eyes: Negative for pain, visual disturbance and dryness.  Respiratory: Negative for cough, hemoptysis, shortness of breath and difficulty breathing.   Cardiovascular: Negative for chest pain, palpitations, hypertension and swelling in legs/feet.  Gastrointestinal: Negative for abdominal pain, blood in stool, constipation, diarrhea, heartburn, nausea and vomiting.  Endocrine: Negative for increased urination.  Genitourinary: Negative for painful urination, nocturia and pelvic pain.  Musculoskeletal: Positive for arthralgias, joint pain, joint swelling and morning stiffness. Negative for myalgias, muscle weakness, muscle tenderness and  myalgias.  Skin: Negative for color change, pallor, rash, hair loss, nodules/bumps, skin tightness, ulcers and sensitivity to sunlight.  Allergic/Immunologic: Negative for susceptible to infections.  Neurological: Positive for memory loss. Negative for dizziness, light-headedness and numbness.  Hematological: Negative for swollen glands.  Psychiatric/Behavioral: Negative for depressed mood, confusion and sleep disturbance. The patient is not nervous/anxious.     PMFS History:  Patient Active Problem List   Diagnosis Date Noted  . Thrombocytopenia ([place]) 07/27/2016  . Psoriasis 06/23/2016  . Psoriatic arthropathy ([place]) 06/23/2016  . High risk medication use 06/23/2016  . Chronic hepatitis C without hepatic coma ([place]) 06/23/2016    History reviewed. No pertinent past medical history.  Family History  Problem Relation Age of Onset  . Alcohol abuse Mother   . Cirrhosis Mother   . Alcohol abuse Father   . Cirrhosis Father   . Heart attack Sister   . Celiac disease Daughter    Past Surgical History:  Procedure Laterality Date  . EYE SURGERY Bilateral 04/2017 05/2017   cataract extraction   . MOLE REMOVAL     on back   . TYMPANOSTOMY TUBE PLACEMENT Right 05/2017   Social History   Social History Narrative  . Not on file    Objective: Vital Signs: BP (!) 163/90 (BP Location: Right Arm, Patient Position: Sitting, Cuff Size: Normal)   Pulse 70   Resp 14   Ht 5' 2.5" (1.588 m)   Wt 158 lb 3.2 oz (71.8 kg)   BMI 28.47 kg/m    Physical Exam  Constitutional: She is oriented to person, place, and time. She appears well-developed and well-nourished.  HENT:  Head: Normocephalic  and atraumatic.  Eyes: Conjunctivae and EOM are normal.  Neck: Normal range of motion.  Cardiovascular: Normal rate, regular rhythm and intact distal pulses.  Murmur heard. Pulmonary/Chest: Effort normal and breath sounds normal.  Abdominal: Soft. Bowel sounds are normal.  Lymphadenopathy:    She has  no cervical adenopathy.  Neurological: She is alert and oriented to person, place, and time.  Skin: Skin is warm and dry. Capillary refill takes less than 2 seconds.  Psoriasis on extensor surfaces of bilateral knee joints Nail dystrophy noted.  Psychiatric: She has a normal mood and affect. Her behavior is normal.  Nursing note and vitals reviewed.    Musculoskeletal Exam:  C-spine, thoracic spine, and lumbar spine good ROM.  Shoulder joints good ROM.  Below left elbow amputation.  Right elbow good ROM.  Right wrist limited ROM with synovitis.  Incomplete right fist formation.  Synovitis as described below.  Good ROM of both hip joints with no discomfort.  Good ROM of both knee joints.  No warmth or effusion noted.  Psoriasis on extensor surfaces of both knee joints.  Ankles joints good ROM.   No tenderness of bilateral trochanteric bursa bilaterally.   CDAI Exam: CDAI Score: 11  Patient Global Assessment: 5 (mm); Provider Global Assessment: 5 (mm) Swollen: 14 ; Tender: 14  Joint Exam      Right  Left  Wrist  Swollen Tender     IP  Swollen Tender     PIP 2  Swollen Tender     PIP 3  Swollen Tender     PIP 4  Swollen Tender     DIP 2  Swollen Tender     DIP 3  Swollen Tender     Ankle  Swollen Tender     MTP 1  Swollen Tender     MTP 2  Swollen Tender  Swollen Tender  MTP 3     Swollen Tender  PIP 2 (toe)     Swollen Tender  PIP 3 (toe)     Swollen Tender     Investigation: No additional findings.  Imaging: No results found.  Recent Labs: Lab Results  Component Value Date   WBC 6.1 10/04/2017   HGB 12.1 10/04/2017   PLT 169 10/04/2017   NA 141 10/04/2017   K 3.9 10/04/2017   CL 112 (H) 10/04/2017   CO2 23 10/04/2017   GLUCOSE 72 10/04/2017   BUN 20 10/04/2017   CREATININE 1.04 (H) 10/04/2017   BILITOT 0.8 10/04/2017   ALKPHOS 112 09/01/2016   AST 36 (H) 10/04/2017   ALT 17 10/04/2017   PROT 6.4 10/04/2017   ALBUMIN 3.5 09/01/2016   CALCIUM 9.0 10/04/2017    GFRAA 63 10/04/2017   QFTBGOLDPLUS NEGATIVE 07/02/2017    Speciality Comments: TB Gold neg 07/02/17  Procedures:  No procedures performed Allergies: Amoxicillin; Hydrochlorothiazide; and Stelara [ustekinumab]   Assessment / Plan:     Visit Diagnoses: Psoriatic arthropathy ([place]): She continues to have synovitis on exam. She continues to have pain in multiple joints. She was started on Cosentyx 300 mg sq injections on 09/02/17.  She has noted some improvement since starting on Cosentyx.  She is advised to notify us if she develops any increased joint pain or joint swelling.  She will follow-up in the office in 3 months to see how she is assess how she is responding to Cosentyx.  Psoriasis: She continues to have psoriasis on the extensor surfaces of both knee joints.  She  has nail dystrophy.   High risk medication use - Cosentyx 300 mg sq injections once a month.  CBC and CMP are stable 10/04/2016.  TB gold negative 07/02/2017.  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    Orders: No orders of the defined types were placed in this encounter.  No orders of the defined types were placed in this encounter.   Follow-Up Instructions: Return in about 3 months (around 01/14/2018) for Psoriatic arthritis.   Ofilia Neas, PA-C   I examined and evaluated the patient with Hazel Sams PA.  Patient is still had some synovitis on examination although she is doing much better after she started taking Cosentyx.  She has significant swelling in the right wrist joint.  I offered cortisone injection but she declined.  Due to her history of hepatitis C and thrombocytopenia we did not use methotrexate.  The plan of care was discussed as noted above.  Bo Merino, MD  Note - This record has been created using Editor, commissioning.    Chart creation errors have been sought, but may not always  have been located. Such creation errors do not reflect on  the standard of medical care.

## 2017-10-01 NOTE — Telephone Encounter (Signed)
Patient left a voicemail requesting prescription refill of Cosentyx.  Patient states is out of medication.  Patient requested a return call.

## 2017-10-04 ENCOUNTER — Other Ambulatory Visit: Payer: Self-pay

## 2017-10-04 DIAGNOSIS — Z79899 Other long term (current) drug therapy: Secondary | ICD-10-CM

## 2017-10-04 LAB — COMPLETE METABOLIC PANEL WITH GFR
AG Ratio: 1 (calc) (ref 1.0–2.5)
ALBUMIN MSPROF: 3.2 g/dL — AB (ref 3.6–5.1)
ALKALINE PHOSPHATASE (APISO): 96 U/L (ref 33–130)
ALT: 17 U/L (ref 6–29)
AST: 36 U/L — AB (ref 10–35)
BUN / CREAT RATIO: 19 (calc) (ref 6–22)
BUN: 20 mg/dL (ref 7–25)
CO2: 23 mmol/L (ref 20–32)
CREATININE: 1.04 mg/dL — AB (ref 0.60–0.93)
Calcium: 9 mg/dL (ref 8.6–10.4)
Chloride: 112 mmol/L — ABNORMAL HIGH (ref 98–110)
GFR, Est African American: 63 mL/min/{1.73_m2} (ref >=60)
GFR, Est Non African American: 54 mL/min/{1.73_m2} — ABNORMAL LOW (ref >=60)
GLUCOSE: 72 mg/dL (ref 65–99)
Globulin: 3.2 g/dL (calc) (ref 1.9–3.7)
Potassium: 3.9 mmol/L (ref 3.5–5.3)
Sodium: 141 mmol/L (ref 135–146)
Total Bilirubin: 0.8 mg/dL (ref 0.2–1.2)
Total Protein: 6.4 g/dL (ref 6.1–8.1)

## 2017-10-04 LAB — CBC WITH DIFFERENTIAL/PLATELET
BASOS PCT: 1.5 %
Basophils Absolute: 92 cells/uL (ref 0–200)
Eosinophils Absolute: 189 cells/uL (ref 15–500)
Eosinophils Relative: 3.1 %
HEMATOCRIT: 35.2 % (ref 35.0–45.0)
HEMOGLOBIN: 12.1 g/dL (ref 11.7–15.5)
LYMPHS ABS: 1690 {cells}/uL (ref 850–3900)
MCH: 32.6 pg (ref 27.0–33.0)
MCHC: 34.4 g/dL (ref 32.0–36.0)
MCV: 94.9 fL (ref 80.0–100.0)
MPV: 10.4 fL (ref 7.5–12.5)
Monocytes Relative: 10.6 %
NEUTROS ABS: 3483 {cells}/uL (ref 1500–7800)
Neutrophils Relative %: 57.1 %
Platelets: 169 10*3/uL (ref 140–400)
RBC: 3.71 10*6/uL — ABNORMAL LOW (ref 3.80–5.10)
RDW: 13.3 % (ref 11.0–15.0)
Total Lymphocyte: 27.7 %
WBC: 6.1 10*3/uL (ref 3.8–10.8)
WBCMIX: 647 {cells}/uL (ref 200–950)

## 2017-10-05 NOTE — Progress Notes (Signed)
Labs are stable.  Low GFR most likely related to diuretic use.  Please forward labs to her PCP.

## 2017-10-15 ENCOUNTER — Ambulatory Visit: Payer: Medicare Other | Admitting: Rheumatology

## 2017-10-15 ENCOUNTER — Encounter: Payer: Self-pay | Admitting: Rheumatology

## 2017-10-15 VITALS — BP 163/90 | HR 70 | Resp 14 | Ht 62.5 in | Wt 158.2 lb

## 2017-10-15 DIAGNOSIS — L409 Psoriasis, unspecified: Secondary | ICD-10-CM | POA: Diagnosis not present

## 2017-10-15 DIAGNOSIS — D696 Thrombocytopenia, unspecified: Secondary | ICD-10-CM

## 2017-10-15 DIAGNOSIS — L4050 Arthropathic psoriasis, unspecified: Secondary | ICD-10-CM | POA: Diagnosis not present

## 2017-10-15 DIAGNOSIS — D472 Monoclonal gammopathy: Secondary | ICD-10-CM

## 2017-10-15 DIAGNOSIS — B182 Chronic viral hepatitis C: Secondary | ICD-10-CM

## 2017-10-15 DIAGNOSIS — Z79899 Other long term (current) drug therapy: Secondary | ICD-10-CM | POA: Diagnosis not present

## 2017-10-15 NOTE — Patient Instructions (Signed)
Standing Labs We placed an order today for your standing lab work.    Please come back and get your standing labs in December and every 3 months   We have open lab Monday through Friday from 8:30-11:30 AM and 1:30-4:00 PM  at the office of Dr. Shaili Deveshwar.   You may experience shorter wait times on Monday and Friday afternoons. The office is located at [address], [address], [place], [place] [address] No appointment is necessary.   Labs are drawn by [facility].  You may receive a bill from [facility] for your lab work. If you have any questions regarding directions or hours of operation,  please call [phone].     

## 2017-10-26 ENCOUNTER — Telehealth: Payer: Self-pay | Admitting: Pharmacy Technician

## 2017-10-26 NOTE — Telephone Encounter (Signed)
Received Cosentyx from [facility], called patient, she thought shipment was coming to her home. She will pick up on Friday, 10/29/17. Placed in [place].  2:14 PM Beatriz Chancellor, CPhT

## 2017-11-26 ENCOUNTER — Telehealth: Payer: Self-pay | Admitting: Rheumatology

## 2017-11-26 NOTE — Telephone Encounter (Signed)
Patient states she contacted [employer] and asked to have her medication shipped her home,. Patient states they states they are unable to ship the medication to her home unless we contact them to change the shipping address to her home. Advised patient would contact the them ad see if we could get it changed before it was shipped to our office. [employer] and they will ship the medication to the patient's home.

## 2017-11-26 NOTE — Telephone Encounter (Signed)
Patient left a voicemail requesting a return call ASAP.

## 2017-11-30 ENCOUNTER — Other Ambulatory Visit: Payer: Self-pay | Admitting: Nurse Practitioner

## 2017-11-30 DIAGNOSIS — K7460 Unspecified cirrhosis of liver: Secondary | ICD-10-CM

## 2017-12-13 ENCOUNTER — Other Ambulatory Visit: Payer: Medicare Other

## 2017-12-20 ENCOUNTER — Telehealth: Payer: Self-pay | Admitting: Pharmacy Technician

## 2017-12-20 NOTE — Telephone Encounter (Signed)
Spoke to patient about renewing patient assistance for [employer]. She will come by office next week to complete application.  9:23 AM Beatriz Chancellor, CPhT

## 2017-12-28 NOTE — Telephone Encounter (Signed)
Patient dropped off completed application, will fax in once Md signature has been received

## 2017-12-31 NOTE — Progress Notes (Deleted)
Office Visit Note  Patient: Carol Horn             Date of Birth: [birth year]           MRN: [id number]             PCP: Chalmers Guest, [place] Referring: Chalmers Guest, FNP Visit Date: 01/14/2018 Occupation: @GUAROCC @  Subjective:  No chief complaint on file.   History of Present Illness: Carol Horn is a 72 y.o. female ***   Activities of Daily Living:  Patient reports morning stiffness for *** {minute/hour:19697}.   Patient {ACTIONS;DENIES/REPORTS:21021675::"Denies"} nocturnal pain.  Difficulty dressing/grooming: {ACTIONS;DENIES/REPORTS:21021675::"Denies"} Difficulty climbing stairs: {ACTIONS;DENIES/REPORTS:21021675::"Denies"} Difficulty getting out of chair: {ACTIONS;DENIES/REPORTS:21021675::"Denies"} Difficulty using hands for taps, buttons, cutlery, and/or writing: {ACTIONS;DENIES/REPORTS:21021675::"Denies"}  No Rheumatology ROS completed.   PMFS History:  Patient Active Problem List   Diagnosis Date Noted  . Thrombocytopenia ([place]) 07/27/2016  . Psoriasis 06/23/2016  . Psoriatic arthropathy ([place]) 06/23/2016  . High risk medication use 06/23/2016  . Chronic hepatitis C without hepatic coma ([place]) 06/23/2016    No past medical history on file.  Family History  Problem Relation Age of Onset  . Alcohol abuse Mother   . Cirrhosis Mother   . Alcohol abuse Father   . Cirrhosis Father   . Heart attack Sister   . Celiac disease Daughter    Past Surgical History:  Procedure Laterality Date  . EYE SURGERY Bilateral 04/2017 05/2017   cataract extraction   . MOLE REMOVAL     on back   . TYMPANOSTOMY TUBE PLACEMENT Right 05/2017   Social History   Social History Narrative  . Not on file    Objective: Vital Signs: There were no vitals taken for this visit.   Physical Exam   Musculoskeletal Exam: ***  CDAI Exam: CDAI Score: Not documented Patient Global Assessment: Not documented; Provider Global Assessment: Not documented Swollen: Not  documented; Tender: Not documented Joint Exam   Not documented   There is currently no information documented on the homunculus. Go to the Rheumatology activity and complete the homunculus joint exam.  Investigation: No additional findings.  Imaging: No results found.  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   Orders: No orders of the defined types were placed in this encounter.  No orders of the defined types were placed in this encounter.   Face-to-face time spent with patient was *** minutes. Greater than 50% of time was spent in counseling and coordination of care.  Follow-Up Instructions: No follow-ups on file.   Earnestine Mealing, CMA  Note - This record has been created using Editor, commissioning.  Chart creation errors have been sought, but may not always  have been located.  Such creation errors do not reflect on  the standard of medical care.

## 2018-01-12 NOTE — Progress Notes (Deleted)
Office Visit Note  Patient: Carol Horn             Date of Birth: 01-Apr-1945           MRN: [id number]             PCP: Chalmers Guest, [place] Referring: Chalmers Guest, FNP Visit Date: 01/20/2018 Occupation: @GUAROCC @  Subjective:  No chief complaint on file.   History of Present Illness: Carol Horn is a 72 y.o. female ***   Activities of Daily Living:  Patient reports morning stiffness for *** {minute/hour:19697}.   Patient {ACTIONS;DENIES/REPORTS:21021675::"Denies"} nocturnal pain.  Difficulty dressing/grooming: {ACTIONS;DENIES/REPORTS:21021675::"Denies"} Difficulty climbing stairs: {ACTIONS;DENIES/REPORTS:21021675::"Denies"} Difficulty getting out of chair: {ACTIONS;DENIES/REPORTS:21021675::"Denies"} Difficulty using hands for taps, buttons, cutlery, and/or writing: {ACTIONS;DENIES/REPORTS:21021675::"Denies"}  No Rheumatology ROS completed.   PMFS History:  Patient Active Problem List   Diagnosis Date Noted  . Thrombocytopenia ([place]) 07/27/2016  . Psoriasis 06/23/2016  . 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 High risk medication use 06/23/2016  . Chronic hepatitis C without hepatic coma ([place]) 06/23/2016    No past medical history on file.  Family History  Problem Relation Age of Onset  . Alcohol abuse Mother   . Cirrhosis Mother   . Alcohol abuse Father   . Cirrhosis Father   . Heart attack Sister   . Celiac disease Daughter    Past Surgical History:  Procedure Laterality Date  . EYE SURGERY Bilateral 04/2017 05/2017   cataract extraction   . MOLE REMOVAL     on back   . TYMPANOSTOMY TUBE PLACEMENT Right 05/2017   Social History   Social History Narrative  . Not on file    Objective: Vital Signs: There were no vitals taken for this visit.   Physical Exam   Musculoskeletal Exam: ***  CDAI Exam: CDAI Score: Not documented Patient Global Assessment: Not documented; Provider Global Assessment: Not documented Swollen: Not  documented; Tender: Not documented Joint Exam   Not documented   There is currently no information documented on the homunculus. Go to the Rheumatology activity and complete the homunculus joint exam.  Investigation: No additional findings.  Imaging: No results found.  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   Orders: No orders of the defined types were placed in this encounter.  No orders of the defined types were placed in this encounter.   Face-to-face time spent with patient was *** minutes. Greater than 50% of time was spent in counseling and coordination of care.  Follow-Up Instructions: No follow-ups on file.   Earnestine Mealing, CMA  Note - This record has been created using Editor, commissioning.  Chart creation errors have been sought, but may not always  have been located.  Such creation errors do not reflect on  the standard of medical care.

## 2018-01-14 ENCOUNTER — Ambulatory Visit: Payer: Medicare Other | Admitting: Rheumatology

## 2018-01-20 ENCOUNTER — Ambulatory Visit: Payer: Medicare Other | Admitting: Rheumatology

## 2018-02-02 NOTE — Telephone Encounter (Signed)
Received fax from [employer], patient's renewal application has been APPROVED. Coverage dates are from 02/02/2018 to 01/26/2019.   Will send documents to [place].   Phone# [phone] Fax# [phone]

## 2018-02-04 ENCOUNTER — Ambulatory Visit
Admission: RE | Admit: 2018-02-04 | Discharge: 2018-02-04 | Disposition: A | Discharge status: Home / Self Care | Payer: Medicare Other | Source: Ambulatory Visit | Attending: Nurse Practitioner | Admitting: Nurse Practitioner

## 2018-02-04 DIAGNOSIS — K7460 Unspecified cirrhosis of liver: Secondary | ICD-10-CM

## 2018-02-04 NOTE — Progress Notes (Signed)
Office Visit Note  Patient: Carol Horn             Date of Birth: Aug 10, 1945           MRN: [id number]             PCP: Chalmers Guest, [place] Referring: Chalmers Guest, FNP Visit Date: 02/18/2018 Occupation: @GUAROCC @  Subjective:  Left knee pain    History of Present Illness: Carol Horn is a 73 y.o. female with history of psoriatic arthritis.  Patient is on Cosentyx 300 mg monthly through [employer] patient assistance.  She started on Cosentyx on 09/02/2017.  She has noticed improvement since switching from Cosentyx to [place].  She continues to have pain in multiple joints though.  She is having severe pain in her left knee joint that is progressively been getting worse over the past 2 months.  She states that she has noticed warmth and swelling in bilateral knee joints.  She states that she has tried wearing a brace on the left knee that did not help.  She continues to have pain in the right hand as well as joint swelling.  She has chronic lower back pain but denies any SI joint pain.  She denies any Achilles denies or plantar fasciitis.  She states that her psoriasis has started to clear up again she continues to have a thickened plaque on the right knee. She continues to have chronic fatigue. She denies any recent infections.    Activities of Daily Living:  Patient reports morning stiffness for 2-3  hours.   Patient Denies nocturnal pain.  Difficulty dressing/grooming: Denies Difficulty climbing stairs: Reports Difficulty getting out of chair: Reports Difficulty using hands for taps, buttons, cutlery, and/or writing: Reports  Review of Systems  Constitutional: Positive for fatigue.  HENT: Negative for mouth sores, mouth dryness and nose dryness.   Eyes: Negative for pain, visual disturbance and dryness.  Respiratory: Negative for cough, hemoptysis, shortness of breath and difficulty breathing.   Cardiovascular: Negative for chest pain, palpitations, hypertension and  swelling in legs/feet.  Gastrointestinal: Negative for blood in stool, constipation and diarrhea.  Endocrine: Negative for increased urination.  Genitourinary: Negative for painful urination.  Musculoskeletal: Positive for arthralgias, joint pain, joint swelling and morning stiffness. Negative for myalgias, muscle weakness, muscle tenderness and myalgias.  Skin: Positive for rash. Negative for color change, pallor, hair loss, nodules/bumps, skin tightness, ulcers and sensitivity to sunlight.  Allergic/Immunologic: Negative for susceptible to infections.  Neurological: Negative for dizziness, numbness, headaches and weakness.  Hematological: Negative for swollen glands.  Psychiatric/Behavioral: Negative for depressed mood and sleep disturbance. The patient is not nervous/anxious.     PMFS History:  Patient Active Problem List   Diagnosis Date Noted  . Thrombocytopenia ([place]) 07/27/2016  . Psoriasis 06/23/2016  . 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 High risk medication use 06/23/2016  . Chronic hepatitis C without hepatic coma ([place]) 06/23/2016    History reviewed. No pertinent past medical history.  Family History  Problem Relation Age of Onset  . Alcohol abuse Mother   . Cirrhosis Mother   . Alcohol abuse Father   . Cirrhosis Father   . Heart attack Sister   . Celiac disease Daughter    Past Surgical History:  Procedure Laterality Date  . EYE SURGERY Bilateral 04/2017 05/2017   cataract extraction   . MOLE REMOVAL     on back   . TYMPANOSTOMY TUBE PLACEMENT Right 05/2017   Social History  Social History Narrative  . Not on file   Immunization History  Administered Date(s) Administered  . Hep A / Hep B 11/24/2016, 07/22/2017  . Hepatitis B 12/22/2016  . Influenza, High Dose Seasonal PF 10/09/2014, 10/29/2015, 11/24/2016, 11/23/2017  . PPD Test 11/19/2014  . Pneumococcal Conjugate-13 09/10/2014  . Pneumococcal Polysaccharide-23 08/16/2012  . Zoster 04/12/2012       Objective: Vital Signs: BP (!) 172/86 (BP Location: Right Arm, Patient Position: Sitting, Cuff Size: Normal)   Pulse 78   Resp 16   Ht 5' 2.5" (1.588 m)   Wt 168 lb 12.8 oz (76.6 kg)   BMI 30.38 kg/m    Physical Exam Vitals signs and nursing note reviewed.  Constitutional:      Appearance: She is well-developed.  HENT:     Head: Normocephalic and atraumatic.  Eyes:     Conjunctiva/sclera: Conjunctivae normal.  Neck:     Musculoskeletal: Normal range of motion.  Cardiovascular:     Rate and Rhythm: Normal rate and regular rhythm.     Heart sounds: Normal heart sounds.  Pulmonary:     Effort: Pulmonary effort is normal.     Breath sounds: Normal breath sounds.  Abdominal:     General: Bowel sounds are normal.     Palpations: Abdomen is soft.  Lymphadenopathy:     Cervical: No cervical adenopathy.  Skin:    General: Skin is warm and dry.     Capillary Refill: Capillary refill takes less than 2 seconds.     Comments: Thick plaques of psoriasis on the extensor surface of the right knee joint.  Psoriasis plaques are clearing on extensor surface of left knee joint.   Nail dystrophy noted but improved.   Neurological:     Mental Status: She is alert and oriented to person, place, and time.  Psychiatric:        Behavior: Behavior normal.      Musculoskeletal Exam: C-spine good ROM.  Mild thoracic kyphosis.  Discomfort with lumbar ROM.  No SI joint tenderness.  Shoulder joint good ROM with no discomfort.  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 Right elbow good ROM with no tenderness or swelling. Right wrist synovitis.  Synovitis of several PIP and DIP joints as described below.  Hip joints good ROM.  Tenderness, warmth, and synovitis of bilateral knee joints. No tenderness or swelling of ankle joints.  No achilles tendonitis or plantar fasciitis.     CDAI Exam: CDAI Score: Not documented Patient Global Assessment: Not documented; Provider Global Assessment: Not  documented Swollen: 10 ; Tender: 10  Joint Exam      Right  Left  Wrist  Swollen Tender     MCP 1  Swollen Tender     IP  Swollen Tender     PIP 2  Swollen Tender     PIP 3  Swollen Tender     PIP 4  Swollen Tender     DIP 2  Swollen Tender     DIP 3  Swollen Tender     Knee  Swollen Tender  Swollen Tender     Investigation: No additional findings.  Imaging: US Abdomen Limited Ruq  Result Date: 02/04/2018 CLINICAL DATA:  Cirrhosis EXAM: ULTRASOUND ABDOMEN LIMITED RIGHT UPPER QUADRANT COMPARISON:  05/03/2017 FINDINGS: Gallbladder: Normally distended without stones or wall thickening. No pericholecystic fluid or sonographic Murphy sign. Common bile duct: Diameter: Normal caliber for age 94 mm diameter Liver: Heterogeneous increased echogenicity with slightly nodular  margins consistent with cirrhosis. Tiny hyperechoic nodule within RIGHT lobe of liver, 8 x 6 x 6 mm. Questionable hypoechoic nodule superiorly RIGHT lobe 5 x 5 x 5 mm. Mildly complicated cysts LEFT lobe 11 x 10 x 8 mm and 9 x 7 x 10 mm. The tiny nodular foci were not definitely noted on the prior exam. Portal vein is patent on color Doppler imaging with normal direction of blood flow towards the liver. No RIGHT upper quadrant free fluid. IMPRESSION: Cirrhotic appearing liver with tiny hyperechoic and hypoechoic question complicated cystic nodules in both lobes, not definitely seen previously; consider further assessment of the liver by MR imaging with and without contrast to characterize and establish baseline of these lesions. Electronically Signed   By: Lavonia Dana M.D.   On: 02/04/2018 16:33    Recent Labs: Lab Results  Component Value Date   WBC 6.1 10/04/2017   HGB 12.1 10/04/2017   PLT 169 10/04/2017   NA 141 10/04/2017   K 3.9 10/04/2017   CL 112 (H) 10/04/2017   CO2 23 10/04/2017   GLUCOSE 72 10/04/2017   BUN 20 10/04/2017   CREATININE 1.04 (H) 10/04/2017   BILITOT 0.8 10/04/2017   ALKPHOS 112 09/01/2016    AST 36 (H) 10/04/2017   ALT 17 10/04/2017   PROT 6.4 10/04/2017   ALBUMIN 3.5 09/01/2016   CALCIUM 9.0 10/04/2017   GFRAA 63 10/04/2017   QFTBGOLDPLUS NEGATIVE 07/02/2017    Speciality Comments: Prior therapy: [place] Nail (GI upset), Enbrel (inadequate response)  Procedures:  No procedures performed Allergies: Amoxicillin; Hydrochlorothiazide; and Stelara [ustekinumab]     Assessment / Plan:     Visit Diagnoses: Psoriatic arthropathy ([place]): She continues to have active synovitis of multiple joints.  She has warmth and swelling of bilateral knee joints.  She continues to have active psoriasis on bilateral knee joints.  She has no SI joint tenderness on exam.  She has no Achilles tendinitis or plantar fasciitis.  She has been on Cosentyx since 09/02/2017.  She has been injecting Cosentyx 300 mg subcutaneously every 28 days.  She has noticed improvement since switching from [place] to Cosentyx.  Considering she still has active synovitis and for psoriasis we discussed adding on Plaquenil to her current treatment regimen.  The indications, contraindications, potential side effects of Plaquenil were discussed.  Consent was obtained today.  All questions were addressed.  She will return for lab work in 1 month and then every 3 months.  We will obtain labs today as well.  She was advised to notify us that shows increased joint pain or joint swelling.  She will follow-up in the office in 3 months.  Patient was counseled on the purpose, proper use, and adverse effects of hydroxychloroquine including nausea/diarrhea, skin rash, headaches, and sun sensitivity.  Discussed importance of annual eye exams while on hydroxychloroquine to monitor to ocular toxicity and discussed importance of frequent laboratory monitoring.  Provided patient with eye exam form for baseline ophthalmologic exam.  Provided patient with educational materials on hydroxychloroquine and answered all questions.  Patient consented to  hydroxychloroquine.  Will upload consent in the media tab.    Dose will be Plaquenil 200 mg twice daily.  Prescription pending lab results.  Psoriasis: She has thick plaques of psoriasis on the extensor surface of the right knee joint.  Psoriasis plaques are clearing on the extensor surface of the left knee joint.  Nail dystrophy appears to be improving.   High risk medication use -Cosentyx 300  mg every 28 days started in August 2019.  Last TB gold negative on 07/02/17.  Most recent CBC/CMP stable on 10/04/17.  Due for CBC/CMP today and will monitor every 3 months. She received flu shot in October and previously Prevnar 13, Pneumovax 23 and Zostavax.  She has not had any recent infections.  She will return for labs in 1 month and then every 3 months. - Plan: COMPLETE METABOLIC PANEL WITH GFR, CBC with Differential/Platelet  Chronic hepatitis C without hepatic coma ([place]): LFTs are chronically elevated.  We are hesitant to add Arava to her current treatment regimen.    Thrombocytopenia ([place]): CBC will be drawn today.   Monoclonal gammopathy - Dr. Irene Limbo  Carpal tunnel syndrome of right wrist  Congenital absence of forearm only, left  Family history of psoriatic arthritis   Orders: Orders Placed This Encounter  Procedures  . COMPLETE METABOLIC PANEL WITH GFR  . CBC with Differential/Platelet   Meds ordered this encounter  Medications  . diclofenac sodium (VOLTAREN) 1 % GEL    Sig: Apply 3 grams to 3 large joints up to 3 times daily    Dispense:  3 Tube    Refill:  3  . hydroxychloroquine (PLAQUENIL) 200 MG tablet    Sig: Take 1 tablet (200 mg total) by mouth 2 (two) times daily.    Dispense:  180 tablet    Refill:  0  . Secukinumab 150 MG/ML SOAJ    Sig: Inject 300 mg into the skin every 28 (twenty-eight) days.    Dispense:  2 pen    Refill:  0    Receives through [employer] Patient Assistance.    Face-to-face time spent with patient was 30 minutes. Greater than 50% of time was spent  in counseling and coordination of care.  Follow-Up Instructions: Return in about 3 months (around 05/20/2018) for Psoriatic arthritis.   Carol Neas, PA-C  Note - This record has been created using Dragon software.  Chart creation errors have been sought, but may not always  have been located. Such creation errors do not reflect on  the standard of medical care.

## 2018-02-14 ENCOUNTER — Other Ambulatory Visit: Payer: Self-pay | Admitting: Nurse Practitioner

## 2018-02-14 DIAGNOSIS — K769 Liver disease, unspecified: Principal | ICD-10-CM

## 2018-02-14 DIAGNOSIS — J918 Pleural effusion in other conditions classified elsewhere: Secondary | ICD-10-CM

## 2018-02-14 DIAGNOSIS — K7469 Other cirrhosis of liver: Secondary | ICD-10-CM

## 2018-02-18 ENCOUNTER — Ambulatory Visit: Payer: Medicare Other | Admitting: Physician Assistant

## 2018-02-18 ENCOUNTER — Encounter: Payer: Self-pay | Admitting: Physician Assistant

## 2018-02-18 VITALS — BP 172/86 | HR 78 | Resp 16 | Ht 62.5 in | Wt 168.8 lb

## 2018-02-18 DIAGNOSIS — Z79899 Other long term (current) drug therapy: Secondary | ICD-10-CM

## 2018-02-18 DIAGNOSIS — Q7152 Longitudinal reduction defect of left ulna: Secondary | ICD-10-CM

## 2018-02-18 DIAGNOSIS — D696 Thrombocytopenia, unspecified: Secondary | ICD-10-CM

## 2018-02-18 DIAGNOSIS — L4050 Arthropathic psoriasis, unspecified: Secondary | ICD-10-CM

## 2018-02-18 DIAGNOSIS — B182 Chronic viral hepatitis C: Secondary | ICD-10-CM | POA: Diagnosis not present

## 2018-02-18 DIAGNOSIS — Q7142 Longitudinal reduction defect of left radius: Secondary | ICD-10-CM

## 2018-02-18 DIAGNOSIS — G5601 Carpal tunnel syndrome, right upper limb: Secondary | ICD-10-CM

## 2018-02-18 DIAGNOSIS — L409 Psoriasis, unspecified: Secondary | ICD-10-CM | POA: Diagnosis not present

## 2018-02-18 DIAGNOSIS — D472 Monoclonal gammopathy: Secondary | ICD-10-CM

## 2018-02-18 DIAGNOSIS — Z840 Family history of diseases of the skin and subcutaneous tissue: Secondary | ICD-10-CM

## 2018-02-18 DIAGNOSIS — Z8261 Family history of arthritis: Secondary | ICD-10-CM

## 2018-02-18 MED ORDER — DICLOFENAC SODIUM 1 % TD GEL: TRANSDERMAL | 3 refills | Status: DC

## 2018-02-18 MED ORDER — SECUKINUMAB 150 MG/ML ~~LOC~~ SOAJ: 300.0000 mg | SUBCUTANEOUS | 0 refills | Status: DC

## 2018-02-18 MED ORDER — HYDROXYCHLOROQUINE SULFATE 200 MG PO TABS: 200.0000 mg | ORAL_TABLET | Freq: Two times a day (BID) | ORAL | 0 refills | Status: DC

## 2018-02-18 NOTE — Telephone Encounter (Signed)
Received a fax from [facility] regarding a prior authorization for [facility]. Authorization has been APPROVED from 02/18/2018 to 01/26/2019.   Will send document to scan center.  Authorization # [id number] Phone # [phone]

## 2018-02-18 NOTE — Patient Instructions (Signed)
Standing Labs We placed an order today for your standing lab work.    Please come back and get your standing labs in 1 month and then every 3 months   We have open lab Monday through Friday from 8:30-11:30 AM and 1:30-4:00 PM  at the office of Dr. Bo Merino.   You may experience shorter wait times on Monday and Friday afternoons. The office is located at [address], [place], [place], [place] [address] No appointment is necessary.   Labs are drawn by [facility].  You may receive a bill from [facility] for your lab work.  If you wish to have your labs drawn at another location, please call the office 24 hours in advance to send orders.  If you have any questions regarding directions or hours of operation,  please call [phone].   Just as a reminder please drink plenty of water prior to coming for your lab work. Thanks!   Hydroxychloroquine tablets What is this medicine? HYDROXYCHLOROQUINE (hye drox ee KLOR oh kwin) is used to treat rheumatoid arthritis and systemic lupus erythematosus. It is also used to treat malaria. This medicine may be used for other purposes; ask your health care provider or pharmacist if you have questions. COMMON BRAND NAME(S): Plaquenil, Quineprox What should I tell my health care provider before I take this medicine? They need to know if you have any of these conditions: -diabetes -eye disease, vision problems -G6PD deficiency -history of blood diseases -history of irregular heartbeat -if you often drink alcohol -kidney disease -liver disease -porphyria -psoriasis -seizures -an unusual or allergic reaction to chloroquine, hydroxychloroquine, other medicines, foods, dyes, or preservatives -pregnant or trying to get pregnant -breast-feeding How should I use this medicine? Take this medicine by mouth with a glass of water. Follow the directions on the prescription label. Avoid taking antacids within 4 hours of taking this medicine. It is best  to separate these medicines by at least 4 hours. Do not cut, crush or chew this medicine. You can take it with or without food. If it upsets your stomach, take it with food. Take your medicine at regular intervals. Do not take your medicine more often than directed. Take all of your medicine as directed even if you think you are better. Do not skip doses or stop your medicine early. Talk to your pediatrician regarding the use of this medicine in children. While this drug may be prescribed for selected conditions, precautions do apply. Overdosage: If you think you have taken too much of this medicine contact a poison control center or emergency room at once. NOTE: This medicine is only for you. Do not share this medicine with others. What if I miss a dose? If you miss a dose, take it as soon as you can. If it is almost time for your next dose, take only that dose. Do not take double or extra doses. What may interact with this medicine? Do not take this medicine with any of the following medications: -cisapride -dofetilide -dronedarone -live virus vaccines -penicillamine -pimozide -thioridazine -ziprasidone This medicine may also interact with the following medications: -ampicillin -antacids -cimetidine -cyclosporine -digoxin -medicines for diabetes, like insulin, glipizide, glyburide -medicines for seizures like carbamazepine, phenobarbital, phenytoin -mefloquine -methotrexate -other medicines that prolong the QT interval (cause an abnormal heart rhythm) -praziquantel This list may not describe all possible interactions. Give your health care provider a list of all the medicines, herbs, non-prescription drugs, or dietary supplements you use. Also tell them if you smoke, drink  alcohol, or use illegal drugs. Some items may interact with your medicine. What should I watch for while using this medicine? Tell your doctor or healthcare professional if your symptoms do not start to get better  or if they get worse. Avoid taking antacids within 4 hours of taking this medicine. It is best to separate these medicines by at least 4 hours. Tell your doctor or health care professional right away if you have any change in your eyesight. Your vision and blood may be tested before and during use of this medicine. This medicine can make you more sensitive to the sun. Keep out of the sun. If you cannot avoid being in the sun, wear protective clothing and use sunscreen. Do not use sun lamps or tanning beds/booths. What side effects may I notice from receiving this medicine? Side effects that you should report to your doctor or health care professional as soon as possible: -allergic reactions like skin rash, itching or hives, swelling of the face, lips, or tongue -changes in vision -decreased hearing or ringing of the ears -redness, blistering, peeling or loosening of the skin, including inside the mouth -seizures -sensitivity to light -signs and symptoms of a dangerous change in heartbeat or heart rhythm like chest pain; dizziness; fast or irregular heartbeat; palpitations; feeling faint or lightheaded, falls; breathing problems -signs and symptoms of liver injury like dark yellow or brown urine; general ill feeling or flu-like symptoms; light-colored stools; loss of appetite; nausea; right upper belly pain; unusually weak or tired; yellowing of the eyes or skin -signs and symptoms of low blood sugar such as feeling anxious; confusion; dizziness; increased hunger; unusually weak or tired; sweating; shakiness; cold; irritable; headache; blurred vision; fast heartbeat; loss of consciousness -uncontrollable head, mouth, neck, arm, or leg movements Side effects that usually do not require medical attention (report to your doctor or health care professional if they continue or are bothersome): -anxious -diarrhea -dizziness -hair loss -headache -irritable -loss of appetite -nausea,  vomiting -stomach pain This list may not describe all possible side effects. Call your doctor for medical advice about side effects. You may report side effects to FDA at 1-800-FDA-1088. Where should I keep my medicine? Keep out of the reach of children. In children, this medicine can cause overdose with small doses. Store at room temperature between 15 and 30 degrees C (59 and 86 degrees F). Protect from moisture and light. Throw away any unused medicine after the expiration date. NOTE: This sheet is a summary. It may not cover all possible information. If you have questions about this medicine, talk to your doctor, pharmacist, or health care provider.  2019 Elsevier/Gold Standard (2015-08-28 14:16:15)

## 2018-02-18 NOTE — Telephone Encounter (Signed)
Received a Prior Authorization request from [employer] for [employer]. Authorization has been submitted to patient's insurance via Cover My Meds. Will update once we receive a response.

## 2018-02-18 NOTE — Progress Notes (Signed)
Pharmacy Note  Subjective: Patient presents today to the [facility] to see Dr. Estanislado Pandy.  Patient seen by the pharmacist for counseling on hydroxychloroquine psoriatic arthritis and plaque psoriasis. She is currently on Cosentyx.  Objective: CMP     Component Value Date/Time   NA 141 10/04/2017 1312   NA 142 09/01/2016 1545   K 3.9 10/04/2017 1312   K 3.3 (L) 09/01/2016 1545   CL 112 (H) 10/04/2017 1312   CO2 23 10/04/2017 1312   CO2 21 (L) 09/01/2016 1545   GLUCOSE 72 10/04/2017 1312   GLUCOSE 83 09/01/2016 1545   BUN 20 10/04/2017 1312   BUN 10.7 09/01/2016 1545   CREATININE 1.04 (H) 10/04/2017 1312   CREATININE 1.0 09/01/2016 1545   CALCIUM 9.0 10/04/2017 1312   CALCIUM 9.7 09/01/2016 1545   PROT 6.4 10/04/2017 1312   PROT 7.6 09/01/2016 1545   PROT 8.0 09/01/2016 1545   ALBUMIN 3.5 09/01/2016 1545   AST 36 (H) 10/04/2017 1312   AST 75 (H) 09/01/2016 1545   ALT 17 10/04/2017 1312   ALT 40 09/01/2016 1545   ALKPHOS 112 09/01/2016 1545   BILITOT 0.8 10/04/2017 1312   BILITOT 1.45 (H) 09/01/2016 1545   GFRNONAA 54 (L) 10/04/2017 1312   GFRAA 63 10/04/2017 1312    CBC    Component Value Date/Time   WBC 6.1 10/04/2017 1312   RBC 3.71 (L) 10/04/2017 1312   HGB 12.1 10/04/2017 1312   HGB 13.0 09/01/2016 1545   HCT 35.2 10/04/2017 1312   HCT 38.2 09/01/2016 1545   PLT 169 10/04/2017 1312   PLT 160 09/01/2016 1545   MCV 94.9 10/04/2017 1312   MCV 100.3 09/01/2016 1545   MCH 32.6 10/04/2017 1312   MCHC 34.4 10/04/2017 1312   RDW 13.3 10/04/2017 1312   RDW 13.6 09/01/2016 1545   LYMPHSABS 1,690 10/04/2017 1312   LYMPHSABS 1.9 09/01/2016 1545   MONOABS 0.7 09/01/2016 1545   EOSABS 189 10/04/2017 1312   EOSABS 0.1 09/01/2016 1545   BASOSABS 92 10/04/2017 1312   BASOSABS 0.1 09/01/2016 1545    Assessment/Plan: Patient was counseled on the purpose, proper use, and adverse effects of hydroxychloroquine including nausea/diarrhea, skin rash,  headaches, and sun sensitivity.  Discussed importance of annual eye exams while on hydroxychloroquine to monitor to ocular toxicity and discussed importance of frequent laboratory monitoring. She has an appointment with her eye doctor on 2/4 and will have toxicity exam done then.  She is to come back in 1 month and then 3 months for labs. Standing orders are in place.   Provided patient with eye exam form for baseline ophthalmologic exam and standing lab instructions.  Provided patient with educational materials on hydroxychloroquine and answered all questions.  Patient consented to hydroxychloroquine.  Will upload consent in the media tab.    Dose will be Plaquenil 200 mg twice daily.  Prescription sent [facility] in [place] per patient request.  She is interested in adding on [place] as she is still having issues with psoriasis.  Concerned about increased risk of infection with Cosentyx and Otezla combination.  Will apply through patient insurance and revisit at next office visit.  All questions encouraged and answered.  Instructed patient to call with any further questions or concerns.  Mariella Saa, PharmD, [facility] Rheumatology Clinical Pharmacist  02/18/2018 1:29 PM

## 2018-02-19 LAB — CBC WITH DIFFERENTIAL/PLATELET
Absolute Monocytes: 633 cells/uL (ref 200–950)
Basophils Absolute: 90 cells/uL (ref 0–200)
Basophils Relative: 1.6 %
EOS ABS: 202 {cells}/uL (ref 15–500)
EOS PCT: 3.6 %
HCT: 35.5 % (ref 35.0–45.0)
Hemoglobin: 12.4 g/dL (ref 11.7–15.5)
Lymphs Abs: 1400 cells/uL (ref 850–3900)
MCH: 33.3 pg — AB (ref 27.0–33.0)
MCHC: 34.9 g/dL (ref 32.0–36.0)
MCV: 95.4 fL (ref 80.0–100.0)
MONOS PCT: 11.3 %
MPV: 11.5 fL (ref 7.5–12.5)
NEUTROS ABS: 3276 {cells}/uL (ref 1500–7800)
Neutrophils Relative %: 58.5 %
PLATELETS: 137 10*3/uL — AB (ref 140–400)
RBC: 3.72 10*6/uL — ABNORMAL LOW (ref 3.80–5.10)
RDW: 13.4 % (ref 11.0–15.0)
Total Lymphocyte: 25 %
WBC: 5.6 10*3/uL (ref 3.8–10.8)

## 2018-02-19 LAB — COMPLETE METABOLIC PANEL WITH GFR
AG RATIO: 1 (calc) (ref 1.0–2.5)
ALT: 13 U/L (ref 6–29)
AST: 31 U/L (ref 10–35)
Albumin: 3.3 g/dL — ABNORMAL LOW (ref 3.6–5.1)
Alkaline phosphatase (APISO): 92 U/L (ref 33–130)
BILIRUBIN TOTAL: 0.9 mg/dL (ref 0.2–1.2)
BUN: 17 mg/dL (ref 7–25)
CALCIUM: 9.1 mg/dL (ref 8.6–10.4)
CHLORIDE: 112 mmol/L — AB (ref 98–110)
CO2: 24 mmol/L (ref 20–32)
Creat: 0.81 mg/dL (ref 0.60–0.93)
GFR, EST NON AFRICAN AMERICAN: 73 mL/min/{1.73_m2} (ref >=60)
GFR, Est African American: 84 mL/min/{1.73_m2} (ref >=60)
Globulin: 3.3 g/dL (calc) (ref 1.9–3.7)
Glucose, Bld: 72 mg/dL (ref 65–99)
POTASSIUM: 3.9 mmol/L (ref 3.5–5.3)
Sodium: 143 mmol/L (ref 135–146)
Total Protein: 6.6 g/dL (ref 6.1–8.1)

## 2018-02-21 NOTE — Progress Notes (Signed)
Plts are low.  She has a known history of thrombocytopenia.  We will continue to monitor.   All other labs are stable.

## 2018-02-23 NOTE — Telephone Encounter (Signed)
Patient had questions about Plaquenil versus Otezla.  Reviewed adverse effects, safety, and efficacy of both medications.  Informed patient that we were able to find evidence for the use of Cosentyx in combination with [facility] with improved efficacy and favorable safety profile.  Patient wishes to proceed with Rutherford Nail.  Rutherford Nail can cause some GI upset so reviewed difference between [facility] side effects and symptoms of inflammatory bowel disease. Stressed importance of monitoring for serious GI complications indicating inflammatory bowel disease such as severe abdominal pain and multiple loose stools daily.  Instructed patient to call our office if she experiences the symptoms.  Patient verbalized understanding.  She has used [place] in the past and was on patient assistance.  Patient will bring income documents to apply for patient assistance.  She will also come to obtain Otezla starter pack.  All questions encouraged and answered.  Instructed patient to call with any further questions or concerns.  Mariella Saa, PharmD, [facility] Rheumatology Clinical Pharmacist  02/23/2018 2:40 PM

## 2018-02-23 NOTE — Telephone Encounter (Signed)
Ran test claim, patient's copay is over $1700 for 1 month supply. Patient will need to sign up for [facility]. Started application. Called patient to discuss, and she had some medication questions, so I transferred to [employer].

## 2018-03-04 ENCOUNTER — Ambulatory Visit
Admission: RE | Admit: 2018-03-04 | Discharge: 2018-03-04 | Disposition: A | Discharge status: Home / Self Care | Payer: Medicare Other | Source: Ambulatory Visit | Attending: Nurse Practitioner | Admitting: Nurse Practitioner

## 2018-03-04 DIAGNOSIS — J918 Pleural effusion in other conditions classified elsewhere: Secondary | ICD-10-CM

## 2018-03-04 DIAGNOSIS — K7469 Other cirrhosis of liver: Secondary | ICD-10-CM

## 2018-03-04 DIAGNOSIS — K769 Liver disease, unspecified: Principal | ICD-10-CM

## 2018-03-04 MED ORDER — GADOXETATE DISODIUM 0.25 MMOL/ML IV SOLN
7.0000 mL | Freq: Once | INTRAVENOUS | Status: AC | PRN
  Administered 2018-03-04: 7 mL via INTRAVENOUS

## 2018-03-04 NOTE — Telephone Encounter (Signed)
Patient dropped off income documents for patient assistance application.  She also picked up a sample starter pack and common side effects reviewed.    Medication Samples have been provided to the patient.  Drug name: [place] Pink Pack Qty: 1 LOT: [id number] Exp.Date: 09/2018  Dosing instructions: Follow directions on pack  The patient has been instructed regarding the correct time, dose, and frequency of taking this medication, including desired effects and most common side effects.   Faxed application and will update patient when we receive a response.  All questions encouraged and answered.  Instructed patient to call with any further questions or concerns.  Mariella Saa, PharmD, [facility] Rheumatology Clinical Pharmacist  03/04/2018 2:11 PM    .

## 2018-03-08 ENCOUNTER — Telehealth: Payer: Self-pay | Admitting: Pharmacy Technician

## 2018-03-08 NOTE — Telephone Encounter (Signed)
Received fax from [employer], patient's application has been APPROVED. Coverage dates are from 03/08/2018 to 01/26/2019.   Will send documents to [place].   Phone# [phone] Fax# [phone]  Called patient to advise, she has a cold and has not started medication yet. Provided her with the phone number for [facility].

## 2018-04-22 ENCOUNTER — Telehealth: Payer: Self-pay | Admitting: Rheumatology

## 2018-04-22 NOTE — Telephone Encounter (Signed)
Patient requesting a refill on Cosentyx sent to [facility]. Also, patient has been sick x2 weeks now. Patient was tested for Covid 19 on Wednesday, but waiting results. (7-8 days) Patient stopped Rutherford Nail for now do to being sick

## 2018-04-22 NOTE — Telephone Encounter (Signed)
Advised patient of information from Hazel Sams, PA-C. patient verbalized understanding and will call the office back once she receives COVID-19 results.

## 2018-04-22 NOTE — Telephone Encounter (Signed)
Please advise patient to hold dose of Cosentyx and Otezla while awaiting COVID-19 test results. If positive she should not resume these medications until the infection has resolved and she is cleared to restart.

## 2018-05-03 ENCOUNTER — Telehealth: Payer: Self-pay | Admitting: Rheumatology

## 2018-05-03 MED ORDER — SECUKINUMAB 150 MG/ML ~~LOC~~ SOAJ: 300.0000 mg | SUBCUTANEOUS | 0 refills | Status: DC

## 2018-05-03 NOTE — Telephone Encounter (Signed)
Patient left a voicemail requesting a return call.   °

## 2018-05-03 NOTE — Telephone Encounter (Signed)
Last Visit: 02/18/18 Next Visit: 05/27/18 Labs: 02/18/18 Plts are low.All other labs are stable. TB Gold: 07/02/17 Neg   Okay to refill per Dr. Estanislado Pandy

## 2018-05-03 NOTE — Telephone Encounter (Signed)
Patient states she received results for the COVID19 testing and it was negative. Patient advised not to resume medications if she was still experiencing any signs/symptoms of sickness or infection. Patient verbalized understanding and requested a refill of cosenytx. Please send to the [employer] patient assistance.

## 2018-05-27 ENCOUNTER — Ambulatory Visit: Payer: Medicare Other | Admitting: Rheumatology

## 2018-06-14 ENCOUNTER — Telehealth: Payer: Self-pay | Admitting: *Deleted

## 2018-06-14 NOTE — Telephone Encounter (Signed)
Please schedule patient for a follow up visit . Patient was due May 2020. Thanks!

## 2018-06-14 NOTE — Telephone Encounter (Signed)
Received a refill request via fax  Last Visit: 02/18/18 Next Visit due in May 2020 Message sent to the front to schedule patient   Patient is no longer taking Otelza as she has switched to Cosentyx. Will not be sending in refill.

## 2018-06-16 NOTE — Telephone Encounter (Signed)
LMOM for patient to call and schedule follow-up appointment.    FYI:  Patient was scheduled for a follow-up on 05/27/18 which she cancelled stating she will call back to reschedule.

## 2018-06-28 ENCOUNTER — Telehealth: Payer: Self-pay | Admitting: Pharmacist

## 2018-06-28 NOTE — Telephone Encounter (Signed)
Patient called our office to discuss Otezla.  She was on Otezla in the past but discontinued due to GI upset.  Patient started another trial with Rutherford Nail in January.  Patient states she was able to tolerate during the starter pack but then started to develop GI upset and diarrhea.  She has stopped [place] due to to GI side effects.  She continues Cosentyx and states that her symptoms are manageable on Cosentyx monotherapy.  Informed patient if she is unable to tolerate [place] she can discontinue.  Patient verbalized understanding.  Informed patient she is due for labs and follow-up appointment.  She is states she will call us back when she looks at her calendar.  All questions encouraged and answered.  Instructed patient to call with any further questions or concerns.  Mariella Saa, PharmD, [facility] Rheumatology Clinical Pharmacist  06/28/2018 3:51 PM

## 2018-06-30 ENCOUNTER — Other Ambulatory Visit: Payer: Self-pay | Admitting: *Deleted

## 2018-06-30 DIAGNOSIS — Z9225 Personal history of immunosupression therapy: Secondary | ICD-10-CM

## 2018-06-30 DIAGNOSIS — Z79899 Other long term (current) drug therapy: Secondary | ICD-10-CM

## 2018-06-30 MED ORDER — SECUKINUMAB 150 MG/ML ~~LOC~~ SOAJ: 300.0000 mg | SUBCUTANEOUS | 0 refills | Status: DC

## 2018-06-30 NOTE — Telephone Encounter (Signed)
Refill request received via fax  Last Visit: 02/18/18 Next Visit: 07/18/18 Labs: 02/18/18 Plts are low.All other labs are stable. TB Gold: 07/02/17 Neg   Patient advised she is due to update labs   Okay to refill 30 day supply per Dr. Estanislado Pandy

## 2018-07-04 NOTE — Progress Notes (Signed)
Office Visit Note  Patient: Carol Horn             Date of Birth: [birth year]           MRN: [id number]             PCP: Chalmers Guest, [place] Referring: Chalmers Guest, FNP Visit Date: 07/18/2018 Occupation: @GUAROCC @  Subjective:  Right hand pain    History of Present Illness: Carol Horn is a 73 y.o. female with history of psoriatic arthritis.  She is on Cosentyx 300 mg sq injections every 28 days.  She has not missed any doses of Cosentyx recently.  She has consented to start on Plaquenil after her last visit in January the patient did not start on this medication.  She is unsure why she did not start.  She has a prescription of Plaquenil at home.  She continues to have left knee joint pain and pain in the right hand.  She reports right hand swelling intermittently.  She is unable to tolerate Otezla and Cosentyx combination due to GI upset.  She cannot tolerate taking prednisone.  She continues to have psoriasis on extensor surfaces of both knee joints.  She states that she has recently been no signs of the psoriasis is cleared up a little bit.  She denies any Achilles tendinitis or plantar fasciitis.  She denies any SI joint pain at this time.  She has been having increased neck pain.  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  She tried a muscle relaxer but cannot tolerate taking it.  She is also tried getting a new pillow and using heating pad.     Activities of Daily Living:  Patient reports morning stiffness for several hours.   Patient Reports nocturnal pain.  Difficulty dressing/grooming: Denies Difficulty climbing stairs: Reports Difficulty getting out of chair: Reports Difficulty using hands for taps, buttons, cutlery, and/or writing: Reports  Review of Systems  Constitutional: Positive for fatigue.  HENT: Positive for mouth dryness. Negative for mouth sores and nose dryness.   Eyes: Negative for pain, visual disturbance and dryness.  Respiratory: Negative  for cough, hemoptysis, shortness of breath and difficulty breathing.   Cardiovascular: Negative for chest pain, palpitations, hypertension and swelling in legs/feet.  Gastrointestinal: Negative for blood in stool, constipation and diarrhea.  Endocrine: Negative for increased urination.  Genitourinary: Negative for painful urination.  Musculoskeletal: Positive for arthralgias, joint pain, joint swelling and morning stiffness. Negative for myalgias, muscle weakness, muscle tenderness and myalgias.  Skin: Negative for color change, pallor, rash, hair loss, nodules/bumps, skin tightness, ulcers and sensitivity to sunlight.  Allergic/Immunologic: Negative for susceptible to infections.  Neurological: Positive for headaches. Negative for numbness and memory loss.  Hematological: Negative for swollen glands.  Psychiatric/Behavioral: Negative for depressed mood, confusion and sleep disturbance. The patient is not nervous/anxious.     PMFS History:  Patient Active Problem List   Diagnosis Date Noted  . Thrombocytopenia ([place]) 07/27/2016  . Psoriasis 06/23/2016  . Psoriatic arthropathy ([place]) 06/23/2016  . High risk medication use 06/23/2016  . Chronic hepatitis C without hepatic coma ([place]) 06/23/2016    History reviewed. No pertinent past medical history.  Family History  Problem Relation Age of Onset  . Alcohol abuse Mother   . Cirrhosis Mother   . Alcohol abuse Father   . Cirrhosis Father   . Heart attack Sister   . Celiac disease Daughter    Past Surgical History:  Procedure Laterality Date  .  EYE SURGERY Bilateral 04/2017 05/2017   cataract extraction   . MOLE REMOVAL     on back   . TYMPANOSTOMY TUBE PLACEMENT Right 05/2017   Social History   Social History Narrative  . Not on file   Immunization History  Administered Date(s) Administered  . Hep A / Hep B 11/24/2016, 07/22/2017  . Hepatitis B 12/22/2016  . Influenza, High Dose Seasonal PF 10/09/2014, 10/29/2015, 11/24/2016,  11/23/2017  . PPD Test 11/19/2014  . Pneumococcal Conjugate-13 09/10/2014  . Pneumococcal Polysaccharide-23 08/16/2012  . Zoster 04/12/2012     Objective: Vital Signs: BP (!) 147/88 (BP Location: Right Arm, Patient Position: Sitting, Cuff Size: Normal)   Pulse 63   Resp 15   Ht 5\' 3"  (1.6 m)   Wt 172 lb (78 kg)   BMI 30.47 kg/m    Physical Exam Vitals signs and nursing note reviewed.  Constitutional:      Appearance: She is well-developed.  HENT:     Head: Normocephalic and atraumatic.  Eyes:     Conjunctiva/sclera: Conjunctivae normal.  Neck:     Musculoskeletal: Normal range of motion.  Cardiovascular:     Rate and Rhythm: Normal rate and regular rhythm.     Heart sounds: Normal heart sounds.  Pulmonary:     Effort: Pulmonary effort is normal.     Breath sounds: Normal breath sounds.  Abdominal:     General: Bowel sounds are normal.     Palpations: Abdomen is soft.  Lymphadenopathy:     Cervical: No cervical adenopathy.  Skin:    General: Skin is warm and dry.     Capillary Refill: Capillary refill takes less than 2 seconds.  Neurological:     Mental Status: She is alert and oriented to person, place, and time.  Psychiatric:        Behavior: Behavior normal.      Musculoskeletal Exam: C-spine, thoracic spine, lumbar spine limited range of motion.  She has discomfort with C-spine range of motion.  Mild thoracic kyphosis noted.  No SI joint tenderness on exam.  Shoulder joints have good range of motion. 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  Right elbow has good range of motion with no tenderness or inflammation.  Right wrist tenderness and synovitis noted.  She has limited range of motion of the right wrist.  No MCP joint tenderness or synovitis noted.  She has synovitis of several PIP and DIP joints of the right hand as described below.  Limited ROM of both hip joints.  Warmth of bilateral knees noted.  No effusion.  No tenderness or swelling of ankle  joints.  No Achilles tendinitis or plantar fasciitis.  CDAI Exam: CDAI Score: - Patient Global: -; Provider Global: - Swollen: 7 ; Tender: 7  Joint Exam      Right  Left  Wrist  Swollen Tender     IP  Swollen Tender     PIP 2  Swollen Tender     PIP 3  Swollen Tender     PIP 4  Swollen Tender     DIP 3  Swollen Tender     Knee     Swollen Tender   There is currently no information documented on the homunculus. Go to the Rheumatology activity and complete the homunculus joint exam.  Investigation: No additional findings.  Imaging: No results found.  Recent Labs: Lab Results  Component Value Date   WBC 5.6 02/18/2018   HGB 12.4 02/18/2018  PLT 137 (L) 02/18/2018   NA 143 02/18/2018   K 3.9 02/18/2018   CL 112 (H) 02/18/2018   CO2 24 02/18/2018   GLUCOSE 72 02/18/2018   BUN 17 02/18/2018   CREATININE 0.81 02/18/2018   BILITOT 0.9 02/18/2018   ALKPHOS 112 09/01/2016   AST 31 02/18/2018   ALT 13 02/18/2018   PROT 6.6 02/18/2018   ALBUMIN 3.5 09/01/2016   CALCIUM 9.1 02/18/2018   GFRAA 84 02/18/2018   QFTBGOLDPLUS NEGATIVE 07/02/2017    Speciality Comments: Prior therapy: Rutherford Nail (GI upset), Enbrel (inadequate response)  Procedures:  No procedures performed Allergies: Amoxicillin, Hydrochlorothiazide, and Stelara [ustekinumab]   Assessment / Plan:     Visit Diagnoses: Psoriatic arthropathy ([place]) -She continues to have active synovitis as described above.  She has chronic pain in her right hand, right wrist, and left knee joint.  Warmth of bilateral knee joints was noted on exam.  She has active synovitis and tenderness of the right wrist joint.  She has no SI joint tenderness.  No Achilles tenderness or plantar fasciitis.  She has been injecting Cosentyx 300 mg subcutaneously every 28 days.  She has not missed any doses of Cosentyx recently.  She could not tolerate taking the combination of Otezla and Cosentyx due to GI side effects.  At her last visit on 02/18/2018  she was consented on Plaquenil, but she did not start on Plaquenil at that time.  We reviewed the indications, contraindications, potential side effects of Plaquenil today.  She will start taking Plaquenil 200 mg 1 tablet in the morning and half tablet in the evening daily.  She was advised to notify us if she cannot tolerate taking Plaquenil.  She cannot tolerate taking prednisone.  She was advised to notify us that shows increased joint pain or joint swelling.  She will follow-up in the office in 3 months.  Psoriasis -She continues to have psoriasis on extensor surfaces of both knee joints.  She has recently had sun exposure which has helped clear some of the psoriasis.  High risk medication use -Cosentyx 300 mg every 28 days and Plaquenil 200 mg 1 tablet twice daily M-F. No Plaquenil eye exam on file.  Last TB gold negative on 07/02/2017.  Due for TB gold today and will monitor yearly.  Most recent CBC/CMP stable except for low platelets on 02/18/2018.  Due for CBC/CMP today and will monitor every 3 months.  Standing orders are in place.  She received the high-dose flu vaccine in October and is up-to-date with pneumonia vaccines.   Prior therapy: Otezla (GI upset), Enbrel (inadequate response) - Plan: CBC with Differential/Platelet, COMPLETE METABOLIC PANEL WITH GFR, QuantiFERON-TB Gold Plus  Other medical conditions are listed as follows:  Chronic hepatitis C without hepatic coma (HCC)  Monoclonal gammopathy - Dr. Irene Limbo   Thrombocytopenia [facility])   Congenital absence of forearm only, left   Carpal tunnel syndrome of right wrist  Family history of psoriatic arthritis    Orders: Orders Placed This Encounter  Procedures  . CBC with Differential/Platelet  . COMPLETE METABOLIC PANEL WITH GFR  . QuantiFERON-TB Gold Plus   Meds ordered this encounter  Medications  . hydroxychloroquine (PLAQUENIL) 200 MG tablet    Sig: Take 1 tablet (200 mg total) by mouth in the morning and 1/2 tablet (100 mg  total) by mouth in the evening.    Dispense:  135 tablet    Refill:  0    Face-to-face time spent with patient was 30  minutes. Greater than 50% of time was spent in counseling and coordination of care.  Follow-Up Instructions: Return in about 3 months (around 10/18/2018) for Psoriatic arthritis.   Ofilia Neas, PA-C  Note - This record has been created using Dragon software.  Chart creation errors have been sought, but may not always  have been located. Such creation errors do not reflect on  the standard of medical care.

## 2018-07-18 ENCOUNTER — Ambulatory Visit (INDEPENDENT_AMBULATORY_CARE_PROVIDER_SITE_OTHER): Payer: Medicare Other | Admitting: Physician Assistant

## 2018-07-18 ENCOUNTER — Other Ambulatory Visit: Payer: Self-pay

## 2018-07-18 ENCOUNTER — Encounter: Payer: Self-pay | Admitting: Physician Assistant

## 2018-07-18 VITALS — BP 147/88 | HR 63 | Resp 15 | Ht 63.0 in | Wt 172.0 lb

## 2018-07-18 DIAGNOSIS — G5601 Carpal tunnel syndrome, right upper limb: Secondary | ICD-10-CM

## 2018-07-18 DIAGNOSIS — Z79899 Other long term (current) drug therapy: Secondary | ICD-10-CM

## 2018-07-18 DIAGNOSIS — Q7142 Longitudinal reduction defect of left radius: Secondary | ICD-10-CM

## 2018-07-18 DIAGNOSIS — L409 Psoriasis, unspecified: Secondary | ICD-10-CM | POA: Diagnosis not present

## 2018-07-18 DIAGNOSIS — L4050 Arthropathic psoriasis, unspecified: Secondary | ICD-10-CM

## 2018-07-18 DIAGNOSIS — D472 Monoclonal gammopathy: Secondary | ICD-10-CM

## 2018-07-18 DIAGNOSIS — B182 Chronic viral hepatitis C: Secondary | ICD-10-CM

## 2018-07-18 DIAGNOSIS — Z8261 Family history of arthritis: Secondary | ICD-10-CM

## 2018-07-18 DIAGNOSIS — Q7152 Longitudinal reduction defect of left ulna: Secondary | ICD-10-CM

## 2018-07-18 DIAGNOSIS — Z840 Family history of diseases of the skin and subcutaneous tissue: Secondary | ICD-10-CM

## 2018-07-18 DIAGNOSIS — D696 Thrombocytopenia, unspecified: Secondary | ICD-10-CM

## 2018-07-18 MED ORDER — HYDROXYCHLOROQUINE SULFATE 200 MG PO TABS: ORAL_TABLET | ORAL | 0 refills | Status: DC

## 2018-07-18 NOTE — Patient Instructions (Signed)
**  Decrease Plaquenil to 1 tablet in the morning and 1/2 tablet in the evening with food.**  Standing Labs We placed an order today for your standing lab work.    Please come back and get your standing labs in September and then every 3 months.  We have open lab daily Monday through Thursday from 8:30-12:30 PM and 1:30-4:30 PM and Friday from 8:30-12:30 PM and 1:30 -4:00 PM at the office of Dr. Bo Merino.   You may experience shorter wait times on Monday and Friday afternoons. The office is located at [address] [place], [place], [place] [address] No appointment is necessary.   Labs are drawn by [facility].  You may receive a bill from [place] for your lab work.  If you wish to have your labs drawn at another location, please call the office 24 hours in advance to send orders.  If you have any questions regarding directions or hours of operation,  please call [phone].   Just as a reminder please drink plenty of water prior to coming for your lab work. Thanks!

## 2018-07-18 NOTE — Progress Notes (Signed)
Pharmacy Note  Subjective: Patient presents today to the [facility] to see Dr. Estanislado Pandy.  Patient seen by the pharmacist for counseling on hydroxychloroquine psoriatic arthritis and plaque psoriasis. She is currently on Cosentyx. Prior therapy includes:Otezla (GI upset) and Enbrel (inadequate response).  She was to start Plaquenil in January but did not.  Objective: CMP     Component Value Date/Time   NA 143 02/18/2018 1149   NA 142 09/01/2016 1545   K 3.9 02/18/2018 1149   K 3.3 (L) 09/01/2016 1545   CL 112 (H) 02/18/2018 1149   CO2 24 02/18/2018 1149   CO2 21 (L) 09/01/2016 1545   GLUCOSE 72 02/18/2018 1149   GLUCOSE 83 09/01/2016 1545   BUN 17 02/18/2018 1149   BUN 10.7 09/01/2016 1545   CREATININE 0.81 02/18/2018 1149   CREATININE 1.0 09/01/2016 1545   CALCIUM 9.1 02/18/2018 1149   CALCIUM 9.7 09/01/2016 1545   PROT 6.6 02/18/2018 1149   PROT 7.6 09/01/2016 1545   PROT 8.0 09/01/2016 1545   ALBUMIN 3.5 09/01/2016 1545   AST 31 02/18/2018 1149   AST 75 (H) 09/01/2016 1545   ALT 13 02/18/2018 1149   ALT 40 09/01/2016 1545   ALKPHOS 112 09/01/2016 1545   BILITOT 0.9 02/18/2018 1149   BILITOT 1.45 (H) 09/01/2016 1545   GFRNONAA 73 02/18/2018 1149   GFRAA 84 02/18/2018 1149    CBC    Component Value Date/Time   WBC 5.6 02/18/2018 1149   RBC 3.72 (L) 02/18/2018 1149   HGB 12.4 02/18/2018 1149   HGB 13.0 09/01/2016 1545   HCT 35.5 02/18/2018 1149   HCT 38.2 09/01/2016 1545   PLT 137 (L) 02/18/2018 1149   PLT 160 09/01/2016 1545   MCV 95.4 02/18/2018 1149   MCV 100.3 09/01/2016 1545   MCH 33.3 (H) 02/18/2018 1149   MCHC 34.9 02/18/2018 1149   RDW 13.4 02/18/2018 1149   RDW 13.6 09/01/2016 1545   LYMPHSABS 1,400 02/18/2018 1149   LYMPHSABS 1.9 09/01/2016 1545   MONOABS 0.7 09/01/2016 1545   EOSABS 202 02/18/2018 1149   EOSABS 0.1 09/01/2016 1545   BASOSABS 90 02/18/2018 1149   BASOSABS 0.1 09/01/2016 1545    Assessment/Plan: Patient was  counseled on the purpose, proper use, and adverse effects of hydroxychloroquine including nausea/diarrhea, skin rash, headaches, and sun sensitivity.  Discussed importance of annual eye exams while on hydroxychloroquine to monitor to ocular toxicity and discussed importance of frequent laboratory monitoring.  Provided patient with eye exam form for baseline ophthalmologic exam and standing lab instructions.  Provided patient with educational materials on hydroxychloroquine and answered all questions.  Patient consented to hydroxychloroquine.  Will upload consent in the media tab.     Dose will be Plaquenil 200 mg in the morning and 100 mg in the evening based on weight of 78 kg and height of 5'3".  She was sent a prescription for 200 mg twice daily in January but did not start.  Patient verbalized understanding of new reduced dose.  All questions encouraged and answered.  Instructed patient to call with any further questions or concerns.  Mariella Saa, PharmD, [facility] Rheumatology Clinical Pharmacist  07/18/2018 3:02 PM

## 2018-07-19 NOTE — Progress Notes (Signed)
Labs are stable.

## 2018-07-20 ENCOUNTER — Telehealth: Payer: Self-pay | Admitting: *Deleted

## 2018-07-20 ENCOUNTER — Telehealth: Payer: Self-pay | Admitting: Rheumatology

## 2018-07-20 LAB — COMPLETE METABOLIC PANEL WITH GFR
AG Ratio: 1 (calc) (ref 1.0–2.5)
ALT: 14 U/L (ref 6–29)
AST: 29 U/L (ref 10–35)
Albumin: 3.3 g/dL — ABNORMAL LOW (ref 3.6–5.1)
Alkaline phosphatase (APISO): 95 U/L (ref 37–153)
BUN: 16 mg/dL (ref 7–25)
CO2: 24 mmol/L (ref 20–32)
Calcium: 9 mg/dL (ref 8.6–10.4)
Chloride: 112 mmol/L — ABNORMAL HIGH (ref 98–110)
Creat: 0.91 mg/dL (ref 0.60–0.93)
GFR, Est African American: 73 mL/min/{1.73_m2} (ref >=60)
GFR, Est Non African American: 63 mL/min/{1.73_m2} (ref >=60)
Globulin: 3.4 g/dL (calc) (ref 1.9–3.7)
Glucose, Bld: 71 mg/dL (ref 65–99)
Potassium: 4.2 mmol/L (ref 3.5–5.3)
Sodium: 141 mmol/L (ref 135–146)
Total Bilirubin: 1 mg/dL (ref 0.2–1.2)
Total Protein: 6.7 g/dL (ref 6.1–8.1)

## 2018-07-20 LAB — QUANTIFERON-TB GOLD PLUS
Mitogen-NIL: >10 IU/mL
NIL: 0.02 IU/mL
QuantiFERON-TB Gold Plus: NEGATIVE
TB1-NIL: 0 IU/mL
TB2-NIL: 0 IU/mL

## 2018-07-20 LAB — CBC WITH DIFFERENTIAL/PLATELET
Absolute Monocytes: 682 cells/uL (ref 200–950)
Basophils Absolute: 68 cells/uL (ref 0–200)
Basophils Relative: 1.1 %
Eosinophils Absolute: 180 cells/uL (ref 15–500)
Eosinophils Relative: 2.9 %
HCT: 35.7 % (ref 35.0–45.0)
Hemoglobin: 12.3 g/dL (ref 11.7–15.5)
Lymphs Abs: 1897 cells/uL (ref 850–3900)
MCH: 33.1 pg — ABNORMAL HIGH (ref 27.0–33.0)
MCHC: 34.5 g/dL (ref 32.0–36.0)
MCV: 96 fL (ref 80.0–100.0)
MPV: 10.6 fL (ref 7.5–12.5)
Monocytes Relative: 11 %
Neutro Abs: 3373 cells/uL (ref 1500–7800)
Neutrophils Relative %: 54.4 %
Platelets: 154 10*3/uL (ref 140–400)
RBC: 3.72 10*6/uL — ABNORMAL LOW (ref 3.80–5.10)
RDW: 12.8 % (ref 11.0–15.0)
Total Lymphocyte: 30.6 %
WBC: 6.2 10*3/uL (ref 3.8–10.8)

## 2018-07-20 MED ORDER — SECUKINUMAB 150 MG/ML ~~LOC~~ SOAJ: 300.0000 mg | SUBCUTANEOUS | 0 refills | Status: DC

## 2018-07-20 NOTE — Telephone Encounter (Signed)
Patient left a voicemail requesting prescription refill of Cosentyx.   

## 2018-07-20 NOTE — Telephone Encounter (Signed)
Last Visit: 07/18/18 Next Visit: 12/19/18 Labs: 07/18/18 stable TB Gold: 07/02/17 neg (updated 07/18/18 pending)  Okay to refill per Dr. Estanislado Pandy

## 2018-07-20 NOTE — Telephone Encounter (Signed)
Patient LMOM needs refill on meds.

## 2018-07-20 NOTE — Telephone Encounter (Signed)
Patient advised prescription has been faxed to [facility].

## 2018-07-21 NOTE — Progress Notes (Signed)
TB gold negative

## 2018-10-11 ENCOUNTER — Other Ambulatory Visit: Payer: Self-pay | Admitting: Physician Assistant

## 2018-10-11 DIAGNOSIS — L4050 Arthropathic psoriasis, unspecified: Secondary | ICD-10-CM

## 2018-10-11 NOTE — Telephone Encounter (Signed)
Last Visit: 07/18/18 Next Visit: 12/19/18 Labs: 07/18/18 stable No Plaquenil eye exam on file  Patient has an appointment scheduled for this appointment. Later this month. Patient does not feel like this medication is helping ans has been scheduled for an appointment to discuss treatment options.  Okay to refill PLQ?

## 2018-10-11 NOTE — Telephone Encounter (Signed)
Spoke with patient and she does not want a refill of plaquenil due to it being ineffective. Patient would like to discontinue medication and discuss treatment options next week at the scheduled appointment. Per Dr. Estanislado Pandy, it is ok to discontinue plaquenil. Patient verbalized understanding.

## 2018-10-12 NOTE — Progress Notes (Signed)
Office Visit Note  Patient: Carol Horn             Date of Birth: Sep 25, 1945           MRN: [id number]             PCP: Chalmers Guest, [place] Referring: Chalmers Guest, FNP Visit Date: 10/17/2018 Occupation: @GUAROCC @  Subjective:  Pain and swelling in multiple joints..  History of Present Illness: Carol Horn is a 73 y.o. female with a history of psoriatic arthritis and psoriasis. Cosentyx 300 mg every 28 days. She recently stopped Plaquenil as she did not notice a difference in her symptoms. She denies missing any doses or recent infections.  She reports pain in bilateral hands and knees.  She reports pain and tightness in her left shoulder.  She has discomfort in her neck.  She is unable to brush her hair and has difficulty brushing her teeth. She has psoriasis rash on her right knee.    Activities of Daily Living:  Patient reports morning stiffness for 24 hours.   Patient Reports nocturnal pain.  Difficulty dressing/grooming: Reports Difficulty climbing stairs: Reports Difficulty getting out of chair: Reports Difficulty using hands for taps, buttons, cutlery, and/or writing: Reports  Review of Systems  Constitutional: Positive for fatigue. Negative for night sweats, weight gain and weight loss.  HENT: Positive for mouth dryness. Negative for mouth sores, trouble swallowing, trouble swallowing and nose dryness.   Eyes: Positive for dryness. Negative for pain, redness, itching and visual disturbance.  Respiratory: Negative for cough, shortness of breath, wheezing and difficulty breathing.   Cardiovascular: Negative for chest pain, palpitations, hypertension, irregular heartbeat and swelling in legs/feet.  Gastrointestinal: Negative for abdominal pain, blood in stool, constipation and diarrhea.  Endocrine: Negative for increased urination.  Genitourinary: Negative for difficulty urinating, painful urination and vaginal dryness.  Musculoskeletal: Positive for  arthralgias, joint pain, joint swelling and morning stiffness. Negative for myalgias, muscle weakness, muscle tenderness and myalgias.  Skin: Negative for color change, rash, hair loss, skin tightness, ulcers and sensitivity to sunlight.  Allergic/Immunologic: Negative for susceptible to infections.  Neurological: Positive for dizziness and numbness. Negative for headaches, memory loss, night sweats and weakness.  Hematological: Negative for bruising/bleeding tendency and swollen glands.  Psychiatric/Behavioral: Negative for depressed mood, confusion and sleep disturbance. The patient is not nervous/anxious.     PMFS History:  Patient Active Problem List   Diagnosis Date Noted  . Thrombocytopenia ([place]) 07/27/2016  . Psoriasis 06/23/2016  . Psoriatic arthropathy ([place]) 06/23/2016  . High risk medication use 06/23/2016  . Chronic hepatitis C without hepatic coma ([place]) 06/23/2016    History reviewed. No pertinent past medical history.  Family History  Problem Relation Age of Onset  . Alcohol abuse Mother   . Cirrhosis Mother   . Alcohol abuse Father   . Cirrhosis Father   . Heart attack Sister   . Celiac disease Daughter    Past Surgical History:  Procedure Laterality Date  . EYE SURGERY Bilateral 04/2017 05/2017   cataract extraction   . MOLE REMOVAL     on back   . TYMPANOSTOMY TUBE PLACEMENT Right 05/2017   Social History   Social History Narrative  . Not on file   Immunization History  Administered Date(s) Administered  . Hep A / Hep B 11/24/2016, 07/22/2017  . Hepatitis B 12/22/2016  . Influenza, High Dose Seasonal PF 10/09/2014, 10/29/2015, 11/24/2016, 11/23/2017  . PPD Test 11/19/2014  . Pneumococcal Conjugate-13 09/10/2014  .  Pneumococcal Polysaccharide-23 08/16/2012  . Zoster 04/12/2012     Objective: Vital Signs: BP (!) 170/98 (BP Location: Right Arm, Patient Position: Sitting, Cuff Size: Normal)   Pulse 74   Resp 14   Ht 5\' 3"  (1.6 m)   Wt 173 lb 12.8 oz  (78.8 kg)   BMI 30.79 kg/m    Physical Exam Vitals signs and nursing note reviewed.  Constitutional:      Appearance: She is well-developed.  HENT:     Head: Normocephalic and atraumatic.  Eyes:     Conjunctiva/sclera: Conjunctivae normal.  Neck:     Musculoskeletal: Normal range of motion.  Cardiovascular:     Rate and Rhythm: Normal rate and regular rhythm.     Heart sounds: Normal heart sounds.  Pulmonary:     Effort: Pulmonary effort is normal.     Breath sounds: Normal breath sounds.  Abdominal:     General: Bowel sounds are normal.     Palpations: Abdomen is soft.  Lymphadenopathy:     Cervical: No cervical adenopathy.  Skin:    General: Skin is warm and dry.     Capillary Refill: Capillary refill takes less than 2 seconds.  Neurological:     Mental Status: She is alert and oriented to person, place, and time.  Psychiatric:        Behavior: Behavior normal.      Musculoskeletal Exam: Patient had discomfort range of motion of her cervical spine and right shoulder.  She had synovitis on her wrist joints and her right hand MCPs PIPs and DIPs as described below.  She had painful range of motion of bilateral knee joints.  She has swelling over MTPs and PIPs as described below.  CDAI Exam: CDAI Score: 16.6  Patient Global: 8 mm; Provider Global: 8 mm Swollen: 14 ; Tender: 17  Joint Exam      Right  Left  Glenohumeral   Tender     Wrist  Swollen Tender     MCP 2  Swollen Tender     MCP 3  Swollen Tender     PIP 2  Swollen Tender     PIP 3  Swollen Tender     PIP 5  Swollen Tender     DIP 2  Swollen Tender     DIP 3  Swollen Tender     Knee   Tender   Tender  MTP 1     Swollen Tender  MTP 2  Swollen Tender     MTP 5     Swollen Tender  PIP 2 (toe)  Swollen Tender     PIP 3 (toe)     Swollen Tender  PIP 4 (toe)  Swollen Tender        Investigation: No additional findings.  Imaging: Xr Foot 2 Views Left  Result Date: 10/17/2018 Severe PIP and DIP  narrowing was noted.  First MTP narrowing was noted.  Erosive changes were noted in all MTP joints.  No intertarsal and tibiotalar joint space narrowing was noted.  Inferior calcaneal spur was noted. Impression: These findings were consistent with severe erosive psoriatic arthritis.  Xr Foot 2 Views Right  Result Date: 10/17/2018 Narrowing of MTP PIP and DIP joints was noted.  Erosive changes were noted in PIP and DIP joints.  Erosive changes were noted in first, third, fourth and fifth MTP joints.  Narrowing of intertarsal joints was noted.  No tibiotalar joint space narrowing was noted. Impression: These findings are  consistent with severe erosive psoriatic arthritis.  Xr Hand 2 View Right  Result Date: 10/17/2018 Severe erosive changes were noted in the PIP and DIP joints.  Ankylosis of all DIP joints and fifth PIP joint was noted.  Erosive changes were noted in the first MCP joint, base of metacarpals carpals and ulnar styloid.  Intercarpal and radiocarpal joint space narrowing was noted. Impression: These findings are consistent with severe erosive psoriatic arthritis  Xr Knee 3 View Left  Result Date: 10/17/2018 Moderate medial compartment narrowing was noted.  No chondrocalcinosis was noted.  Moderate patellofemoral narrowing was noted. Impression: These findings are consistent with moderate osteoarthritis and moderate chondromalacia patella.  Xr Knee 3 View Right  Result Date: 10/17/2018 Moderate medial compartment narrowing was noted.  No chondrocalcinosis was noted.  Moderate patellofemoral narrowing was noted. Impression: These findings are consistent with moderate osteoarthritis and moderate chondromalacia patella.  Xr Lumbar Spine 2-3 Views  Result Date: 10/17/2018 Multilevel spondylosis was noted.  Narrowing between L1-L2, L4-L5 was noted and facet joint arthropathy was noted. Impression: These findings are consistent with multilevel spondylosis and facet joint arthropathy.  Xr  Shoulder Left  Result Date: 10/17/2018 No glenohumeral, acromioclavicular joint space narrowing was noted.  No chondrocalcinosis was noted. Impression: Unremarkable x-ray of the shoulder joint.   Recent Labs: Lab Results  Component Value Date   WBC 6.2 07/18/2018   HGB 12.3 07/18/2018   PLT 154 07/18/2018   NA 141 07/18/2018   K 4.2 07/18/2018   CL 112 (H) 07/18/2018   CO2 24 07/18/2018   GLUCOSE 71 07/18/2018   BUN 16 07/18/2018   CREATININE 0.91 07/18/2018   BILITOT 1.0 07/18/2018   ALKPHOS 112 09/01/2016   AST 29 07/18/2018   ALT 14 07/18/2018   PROT 6.7 07/18/2018   ALBUMIN 3.5 09/01/2016   CALCIUM 9.0 07/18/2018   GFRAA 73 07/18/2018   QFTBGOLDPLUS NEGATIVE 07/18/2018    Speciality Comments: Prior therapy: Otezla (GI upset), Enbrel (inadequate response)  Procedures:  No procedures performed Allergies: Amoxicillin, Hydrochlorothiazide, and Stelara [ustekinumab]   Assessment / Plan:     Visit Diagnoses: Psoriatic arthropathy ([place]) -patient has severe erosive psoriatic arthritis.  She has synovitis in multiple joints and dactylitis today.  She has inadequate response or hesitation to go on medications due to the side effects.  She also has history of hepatitis C in the past.  She is on Cosentyx.  She states that she does really well for 2 weeks and then the symptoms start flaring.  We discussed this patient Cosentyx to every 2 weeks instead of taking it once a month.  She wants to try it for now.  If that is does not give her adequate relief over the next 2 months then we will switch her to [employer].  She was in agreement.  Psoriasis-she has few psoriasis patches.  High risk medication use - Cosentyx 300 mg every 28 days (started August 2019) Last TB gold negative on 07/18/2018 and will monitor yearly.  Most recent CBC/CMP within normal limits on 07/18/2018.  Due for CBC/CMP today and will monitor every 3 months.   Prior therapy: Otezla (GI upset), Enbrel, Plaquenil (inadequate  response)  ,-Stelara (not covered by insurance)- Plan: CBC with Differential/Platelet, COMPLETE METABOLIC PANEL WITH GFR  Pain in right hand -patient has synovitis in her hand.  X-rays reveal severe erosive psoriatic arthritis.  Plan: XR Hand 2 View Right  Chronic pain of both knees -she complains of ongoing discomfort in her knee  joints.  The x-ray showed moderate osteoarthritis and moderate chondromalacia patella.  Plan: XR KNEE 3 VIEW RIGHT, XR KNEE 3 VIEW LEFT  Bilateral foot pain -she had pain and swelling in her feet.  X-ray showed severe erosive psoriatic arthritis.  Plan: XR Foot 2 Views Right, XR Foot 2 Views Left  Chronic left shoulder pain - Plan: XR Shoulder Left.  The x-ray was unremarkable.  Chronic midline low back pain, unspecified whether sciatica present - Plan: XR Lumbar Spine 2-3 Views.  She has multilevel spondylosis and facet joint arthropathy.   Carpal tunnel syndrome of right wrist  Chronic hepatitis C without hepatic coma (HCC)-patient could not take the treatment for hepatitis C.  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  Thrombocytopenia (HCC)-recently her platelets have been within normal limits.  Congenital absence of forearm only, left  Family history of psoriatic arthritis   Orders: Orders Placed This Encounter  Procedures  . XR KNEE 3 VIEW RIGHT  . XR KNEE 3 VIEW LEFT  . XR Foot 2 Views Right  . XR Foot 2 Views Left  . XR Hand 2 View Right  . XR Shoulder Left  . XR Lumbar Spine 2-3 Views  . CBC with Differential/Platelet  . COMPLETE METABOLIC PANEL WITH GFR   No orders of the defined types were placed in this encounter.   Face-to-face time spent with patient was 30 minutes. Greater than 50% of time was spent in counseling and coordination of care.  Follow-Up Instructions: Return in about 2 months (around 12/17/2018) for Psoriatic arthritis.   Bo Merino, MD  Note - This record has been created using Editor, commissioning.  Chart  creation errors have been sought, but may not always  have been located. Such creation errors do not reflect on  the standard of medical care.

## 2018-10-17 ENCOUNTER — Ambulatory Visit: Payer: Self-pay

## 2018-10-17 ENCOUNTER — Ambulatory Visit (INDEPENDENT_AMBULATORY_CARE_PROVIDER_SITE_OTHER): Payer: Medicare Other | Admitting: Rheumatology

## 2018-10-17 ENCOUNTER — Other Ambulatory Visit: Payer: Self-pay

## 2018-10-17 ENCOUNTER — Encounter: Payer: Self-pay | Admitting: Rheumatology

## 2018-10-17 VITALS — BP 170/98 | HR 74 | Resp 14 | Ht 63.0 in | Wt 173.8 lb

## 2018-10-17 DIAGNOSIS — G5601 Carpal tunnel syndrome, right upper limb: Secondary | ICD-10-CM | POA: Diagnosis not present

## 2018-10-17 DIAGNOSIS — Z79899 Other long term (current) drug therapy: Secondary | ICD-10-CM

## 2018-10-17 DIAGNOSIS — M545 Low back pain, unspecified: Secondary | ICD-10-CM

## 2018-10-17 DIAGNOSIS — M79671 Pain in right foot: Secondary | ICD-10-CM

## 2018-10-17 DIAGNOSIS — L4050 Arthropathic psoriasis, unspecified: Secondary | ICD-10-CM | POA: Diagnosis not present

## 2018-10-17 DIAGNOSIS — M25561 Pain in right knee: Secondary | ICD-10-CM | POA: Diagnosis not present

## 2018-10-17 DIAGNOSIS — M79641 Pain in right hand: Secondary | ICD-10-CM

## 2018-10-17 DIAGNOSIS — M25512 Pain in left shoulder: Secondary | ICD-10-CM | POA: Diagnosis not present

## 2018-10-17 DIAGNOSIS — M25562 Pain in left knee: Secondary | ICD-10-CM | POA: Diagnosis not present

## 2018-10-17 DIAGNOSIS — Q7142 Longitudinal reduction defect of left radius: Secondary | ICD-10-CM

## 2018-10-17 DIAGNOSIS — L409 Psoriasis, unspecified: Secondary | ICD-10-CM

## 2018-10-17 DIAGNOSIS — Z840 Family history of diseases of the skin and subcutaneous tissue: Secondary | ICD-10-CM

## 2018-10-17 DIAGNOSIS — M79672 Pain in left foot: Secondary | ICD-10-CM | POA: Diagnosis not present

## 2018-10-17 DIAGNOSIS — B182 Chronic viral hepatitis C: Secondary | ICD-10-CM

## 2018-10-17 DIAGNOSIS — Z8261 Family history of arthritis: Secondary | ICD-10-CM

## 2018-10-17 DIAGNOSIS — D696 Thrombocytopenia, unspecified: Secondary | ICD-10-CM

## 2018-10-17 DIAGNOSIS — D472 Monoclonal gammopathy: Secondary | ICD-10-CM

## 2018-10-17 DIAGNOSIS — Q7152 Longitudinal reduction defect of left ulna: Secondary | ICD-10-CM

## 2018-10-17 DIAGNOSIS — Z780 Asymptomatic menopausal state: Secondary | ICD-10-CM

## 2018-10-17 DIAGNOSIS — G8929 Other chronic pain: Secondary | ICD-10-CM

## 2018-10-17 NOTE — Patient Instructions (Signed)
Vaccines You are taking a medication(s) that can suppress your immune system.  The following immunizations are recommended: . Flu annually . Pneumonia (Pneumovax 23 and Prevnar 13 spaced at least 1 year apart) . Shingrix  Please check with your PCP to make sure you are up to date.

## 2018-10-18 LAB — CBC WITH DIFFERENTIAL/PLATELET
Absolute Monocytes: 708 cells/uL (ref 200–950)
Basophils Absolute: 79 cells/uL (ref 0–200)
Basophils Relative: 1.3 %
Eosinophils Absolute: 159 cells/uL (ref 15–500)
Eosinophils Relative: 2.6 %
HCT: 37.2 % (ref 35.0–45.0)
Hemoglobin: 12.9 g/dL (ref 11.7–15.5)
Lymphs Abs: 1519 cells/uL (ref 850–3900)
MCH: 33 pg (ref 27.0–33.0)
MCHC: 34.7 g/dL (ref 32.0–36.0)
MCV: 95.1 fL (ref 80.0–100.0)
MPV: 10.7 fL (ref 7.5–12.5)
Monocytes Relative: 11.6 %
Neutro Abs: 3636 cells/uL (ref 1500–7800)
Neutrophils Relative %: 59.6 %
Platelets: 152 10*3/uL (ref 140–400)
RBC: 3.91 10*6/uL (ref 3.80–5.10)
RDW: 12.7 % (ref 11.0–15.0)
Total Lymphocyte: 24.9 %
WBC: 6.1 10*3/uL (ref 3.8–10.8)

## 2018-10-18 LAB — COMPLETE METABOLIC PANEL WITH GFR
AG Ratio: 1.1 (calc) (ref 1.0–2.5)
ALT: 12 U/L (ref 6–29)
AST: 36 U/L — ABNORMAL HIGH (ref 10–35)
Albumin: 3.6 g/dL (ref 3.6–5.1)
Alkaline phosphatase (APISO): 88 U/L (ref 37–153)
BUN: 15 mg/dL (ref 7–25)
CO2: 19 mmol/L — ABNORMAL LOW (ref 20–32)
Calcium: 9.4 mg/dL (ref 8.6–10.4)
Chloride: 111 mmol/L — ABNORMAL HIGH (ref 98–110)
Creat: 0.86 mg/dL (ref 0.60–0.93)
GFR, Est African American: 78 mL/min/{1.73_m2} (ref >=60)
GFR, Est Non African American: 67 mL/min/{1.73_m2} (ref >=60)
Globulin: 3.3 g/dL (calc) (ref 1.9–3.7)
Glucose, Bld: 89 mg/dL (ref 65–99)
Potassium: 4.1 mmol/L (ref 3.5–5.3)
Sodium: 142 mmol/L (ref 135–146)
Total Bilirubin: 0.9 mg/dL (ref 0.2–1.2)
Total Protein: 6.9 g/dL (ref 6.1–8.1)

## 2018-10-18 NOTE — Progress Notes (Signed)
Mild elevation in LFTs noted.  Please add hepatitis C RNA quant.

## 2018-10-19 ENCOUNTER — Telehealth: Payer: Self-pay | Admitting: *Deleted

## 2018-10-19 DIAGNOSIS — B182 Chronic viral hepatitis C: Secondary | ICD-10-CM

## 2018-10-19 NOTE — Telephone Encounter (Signed)
-----   Message from Bo Merino, MD sent at 10/18/2018 12:38 PM EDT ----- Mild elevation in LFTs noted.  Please add hepatitis C RNA quant.

## 2018-10-24 ENCOUNTER — Other Ambulatory Visit: Payer: Self-pay

## 2018-10-24 DIAGNOSIS — B182 Chronic viral hepatitis C: Secondary | ICD-10-CM

## 2018-10-26 LAB — HEPATITIS C RNA QUANTITATIVE
HCV Quantitative Log: <1.18 Log IU/mL
HCV RNA, PCR, QN: <15 IU/mL

## 2018-10-27 NOTE — Progress Notes (Signed)
Hep C is not detectable.

## 2018-11-02 ENCOUNTER — Other Ambulatory Visit: Payer: Self-pay | Admitting: *Deleted

## 2018-11-02 MED ORDER — SECUKINUMAB 150 MG/ML ~~LOC~~ SOAJ: 300.0000 mg | SUBCUTANEOUS | 2 refills | Status: DC

## 2018-11-02 NOTE — Telephone Encounter (Signed)
Last Visit: 10/17/18 Next Visit: 12/19/18 Labs: 10/17/18 Mild elevation in LFTs TB Gold: 07/18/18   Okay to refill per Dr. Estanislado Pandy

## 2018-11-30 ENCOUNTER — Other Ambulatory Visit: Payer: Self-pay

## 2018-11-30 DIAGNOSIS — Z20822 Contact with and (suspected) exposure to covid-19: Secondary | ICD-10-CM

## 2018-12-01 LAB — NOVEL CORONAVIRUS, NAA: SARS-CoV-2, NAA: NOT DETECTED

## 2018-12-02 ENCOUNTER — Telehealth: Payer: Self-pay | Admitting: General Practice

## 2018-12-02 NOTE — Telephone Encounter (Signed)
Negative COVID results given. Patient results "NOT Detected." Caller expressed understanding. ° °

## 2018-12-05 NOTE — Progress Notes (Signed)
Virtual Visit via Video Note  I connected with Carol Horn on 12/19/18 at 11:20 AM EST by a video enabled telemedicine application and verified that I am speaking with the correct person using two identifiers.  Location: Patient: Home  Provider: Clinic  This service was conducted via virtual visit.  Both audio and visual tools were used.  The patient was located at home. I was located in my office.  Consent was obtained prior to the virtual visit and is aware of possible charges through their insurance for this visit.  The patient is an established patient.  Dr. Estanislado Pandy, MD conducted the virtual visit and Hazel Sams, PA-C acted as scribe during the service.  Office staff helped with scheduling follow up visits after the service was conducted.   I discussed the limitations of evaluation and management by telemedicine and the availability of in person appointments. The patient expressed understanding and agreed to proceed.  CC: Right hand pain  History of Present Illness: Patient is a 73 year old female with a past medical history psoriatic arthritis.  She is on Cosentyx 150 mg sq injections every 14 days.  She has not noticed much improvement since starting Cosentyx in August and sparing the doses in September.  She continues to have pain in the right hand and both knee joints.  She has joint stiffness and joint swelling in the right hand currently.  She takes advil 200 mg 6 tablets daily for pain relief.  She has a history of elevated LFTs.  She cannot tolerate taking prednisone.   Review of Systems  Constitutional: Negative for fever and malaise/fatigue.  Eyes: Negative for photophobia, pain, discharge and redness.  Respiratory: Positive for cough. Negative for shortness of breath and wheezing.   Cardiovascular: Negative for chest pain and palpitations.  Gastrointestinal: Negative for blood in stool, constipation and diarrhea.  Genitourinary: Negative for dysuria.   Musculoskeletal: Positive for joint pain. Negative for back pain, myalgias and neck pain.       +Morning stiffness +Joint swelling  Skin: Negative for rash.  Neurological: Negative for dizziness and headaches.  Psychiatric/Behavioral: Negative for depression. The patient is not nervous/anxious and does not have insomnia.       Observations/Objective: Physical Exam  Constitutional: She is oriented to person, place, and time and well-developed, well-nourished, and in no distress.  HENT:  Head: Normocephalic and atraumatic.  Eyes: Conjunctivae are normal.  Pulmonary/Chest: Effort normal.  Neurological: She is alert and oriented to person, place, and time.  Psychiatric: Mood, memory, affect and judgment normal.   Patient reports morning stiffness for 1 hour.   Patient reports nocturnal pain.  Difficulty dressing/grooming: Denies Difficulty climbing stairs: Reports Difficulty getting out of chair: Reports Difficulty using hands for taps, buttons, cutlery, and/or writing: Reports   Assessment and Plan: Visit Diagnoses: Psoriatic arthropathy ([place]) -Severe erosive psoriatic arthritis: She has persistent pain in the right hand and both knee joints.  She is on Cosentyx 150 mg sq injections every 14 days.  She was started on cosentyx in August 2020 and was advised to start spacing the dose of cosentyx to 150 mg sq every 14 days in September 2020.  She has not noticed any benefit since spacing the dose.  She has a history of elevated LFTs, and she takes about 6 tablets of advil daily.  We advised her to discontinue Advil, but we are hesitant to add on MTX or Arava at this time.  She also has a history of  untreated hepatitis C.  She cannot tolerate taking prednisone.  Her treatment options are limited.  She previously had an inadequate response to Plaquenil, but we will try adding plaquenil on to her current treatment regimen.  Indications, contraindications, and potential side effects of PLQ were  dicussed.  The consent form, eye exam, and informational handout will be sent to the patient. She will return for lab work in 36 month. She will return to the office in 3 months.  If she has persistent joint pain and joint swelling, we will consider starting her on Taltz.   Patient was counseled on the purpose, proper use, and adverse effects of hydroxychloroquine including nausea/diarrhea, skin rash, headaches, and sun sensitivity.  Discussed importance of annual eye exams while on hydroxychloroquine to monitor to ocular toxicity and discussed importance of frequent laboratory monitoring.  Provided patient with eye exam form for baseline ophthalmologic exam.  Provided patient with educational materials on hydroxychloroquine and answered all questions.  Patient consented to hydroxychloroquine.  Will upload consent in the media tab.    Dose will be Plaquenil 200 mg twice daily.  Prescription pending lab results.   Psoriasis-She has a large thick patch of psoriasis on her right knee.     High risk medication use - Cosentyx 300 mg every  14 days (started August 2019) Last TB gold negative on 07/18/2018 and will monitor yearly. She will return for lab work in 28 month.  She has history of elevated LFTs-due to taking advil daily.   Prior therapy: Otezla (GI upset), Enbrel, Plaquenil (inadequate response)  ,-Stelara (not covered by insurance)  Primary osteoarthritis of both knee joints: The x-ray showed moderate osteoarthritis and moderate chondromalacia patella.  She has chronic pain and intermittent swelling in both knee joints.    Bilateral foot pain -X-ray showed severe erosive psoriatic arthritis.   Chronic left shoulder pain -X-ray was unremarkable.  Spondylosis without myelopathy or radiculopathy, lumbar region: She has multilevel spondylosis and facet joint arthropathy.  Other medical conditions are listed as follows:   Carpal tunnel syndrome of right wrist: She has intermittent right  hand paresthesias.   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  Thrombocytopenia (HCC)-recently her platelets have been within normal limits.  Congenital absence of forearm only, left  Family history of psoriatic arthritis   Follow Up Instructions: She will follow up in 3 months.   I discussed the assessment and treatment plan with the patient. The patient was provided an opportunity to ask questions and all were answered. The patient agreed with the plan and demonstrated an understanding of the instructions.   The patient was advised to call back or seek an in-person evaluation if the symptoms worsen or if the condition fails to improve as anticipated.  I provided 25 minutes of non-face-to-face time during this encounter.   Bo Merino, MD  Scribed by-  Hazel Sams, PA-C

## 2018-12-19 ENCOUNTER — Encounter: Payer: Self-pay | Admitting: Physician Assistant

## 2018-12-19 ENCOUNTER — Other Ambulatory Visit: Payer: Self-pay

## 2018-12-19 ENCOUNTER — Telehealth (INDEPENDENT_AMBULATORY_CARE_PROVIDER_SITE_OTHER): Payer: Medicare Other | Admitting: Physician Assistant

## 2018-12-19 DIAGNOSIS — M170 Bilateral primary osteoarthritis of knee: Secondary | ICD-10-CM

## 2018-12-19 DIAGNOSIS — L4050 Arthropathic psoriasis, unspecified: Secondary | ICD-10-CM | POA: Diagnosis not present

## 2018-12-19 DIAGNOSIS — B182 Chronic viral hepatitis C: Secondary | ICD-10-CM

## 2018-12-19 DIAGNOSIS — Q7152 Longitudinal reduction defect of left ulna: Secondary | ICD-10-CM

## 2018-12-19 DIAGNOSIS — L409 Psoriasis, unspecified: Secondary | ICD-10-CM | POA: Diagnosis not present

## 2018-12-19 DIAGNOSIS — Z79899 Other long term (current) drug therapy: Secondary | ICD-10-CM

## 2018-12-19 DIAGNOSIS — D696 Thrombocytopenia, unspecified: Secondary | ICD-10-CM

## 2018-12-19 DIAGNOSIS — M47816 Spondylosis without myelopathy or radiculopathy, lumbar region: Secondary | ICD-10-CM

## 2018-12-19 DIAGNOSIS — G5601 Carpal tunnel syndrome, right upper limb: Secondary | ICD-10-CM

## 2018-12-19 DIAGNOSIS — Q7142 Longitudinal reduction defect of left radius: Secondary | ICD-10-CM

## 2018-12-19 NOTE — Patient Instructions (Signed)
Standing Labs We placed an order today for your standing lab work.    Please come back and get your standing labs in 1 month and then every 3 months.   We have open lab daily Monday through Thursday from 8:30-12:30 PM and 1:30-4:30 PM and Friday from 8:30-12:30 PM and 1:30-4:00 PM at the office of Dr. Bo Merino.   You may experience shorter wait times on Monday and Friday afternoons. The office is located at [address] [place], [place], [place] [address] No appointment is necessary.   Labs are drawn by [facility].  You may receive a bill from [facility] for your lab work.  If you wish to have your labs drawn at another location, please call the office 24 hours in advance to send orders.  If you have any questions regarding directions or hours of operation,  please call [phone].   Just as a reminder please drink plenty of water prior to coming for your lab work. Thanks!     Hydroxychloroquine tablets What is this medicine? HYDROXYCHLOROQUINE (hye drox ee KLOR oh kwin) is used to treat rheumatoid arthritis and systemic lupus erythematosus. It is also used to treat malaria. This medicine may be used for other purposes; ask your health care provider or pharmacist if you have questions. COMMON BRAND NAME(S): Plaquenil, Quineprox What should I tell my health care provider before I take this medicine? They need to know if you have any of these conditions:  diabetes  eye disease, vision problems  G6PD deficiency  heart disease  history of irregular heartbeat  if you often drink alcohol  kidney disease  liver disease  porphyria  psoriasis  an unusual or allergic reaction to chloroquine, hydroxychloroquine, other medicines, foods, dyes, or preservatives  pregnant or trying to get pregnant  breast-feeding How should I use this medicine? Take this medicine by mouth with a glass of water. Follow the directions on the prescription label. Do not cut, crush or chew  this medicine. Swallow the tablets whole. Take this medicine with food. Avoid taking antacids within 4 hours of taking this medicine. It is best to separate these medicines by at least 4 hours. Take your medicine at regular intervals. Do not take it more often than directed. Take all of your medicine as directed even if you think you are better. Do not skip doses or stop your medicine early. Talk to your pediatrician regarding the use of this medicine in children. While this drug may be prescribed for selected conditions, precautions do apply. Overdosage: If you think you have taken too much of this medicine contact a poison control center or emergency room at once. NOTE: This medicine is only for you. Do not share this medicine with others. What if I miss a dose? If you miss a dose, take it as soon as you can. If it is almost time for your next dose, take only that dose. Do not take double or extra doses. What may interact with this medicine? Do not take this medicine with any of the following medications:  cisapride  dronedarone  pimozide  thioridazine This medicine may also interact with the following medications:  ampicillin  antacids  cimetidine  cyclosporine  digoxin  kaolin  medicines for diabetes, like insulin, glipizide, glyburide  medicines for seizures like carbamazepine, phenobarbital, phenytoin  mefloquine  methotrexate  other medicines that prolong the QT interval (cause an abnormal heart rhythm)  praziquantel This list may not describe all possible interactions. Give your health care  provider a list of all the medicines, herbs, non-prescription drugs, or dietary supplements you use. Also tell them if you smoke, drink alcohol, or use illegal drugs. Some items may interact with your medicine. What should I watch for while using this medicine? Visit your health care professional for regular checks on your progress. Tell your health care professional if your  symptoms do not start to get better or if they get worse. You may need blood work done while you are taking this medicine. If you take other medicines that can affect heart rhythm, you may need more testing. Talk to your health care professional if you have questions. Your vision may be tested before and during use of this medicine. Tell your health care professional right away if you have any change in your eyesight. What side effects may I notice from receiving this medicine? Side effects that you should report to your doctor or health care professional as soon as possible:  allergic reactions like skin rash, itching or hives, swelling of the face, lips, or tongue  changes in vision  decreased hearing or ringing of the ears  muscle weakness  redness, blistering, peeling or loosening of the skin, including inside the mouth  sensitivity to light  signs and symptoms of a dangerous change in heartbeat or heart rhythm like chest pain; dizziness; fast or irregular heartbeat; palpitations; feeling faint or lightheaded, falls; breathing problems  signs and symptoms of liver injury like dark yellow or brown urine; general ill feeling or flu-like symptoms; light-colored stools; loss of appetite; nausea; right upper belly pain; unusually weak or tired; yellowing of the eyes or skin  signs and symptoms of low blood sugar such as feeling anxious; confusion; dizziness; increased hunger; unusually weak or tired; sweating; shakiness; cold; irritable; headache; blurred vision; fast heartbeat; loss of consciousness  suicidal thoughts  uncontrollable head, mouth, neck, arm, or leg movements Side effects that usually do not require medical attention (report to your doctor or health care professional if they continue or are bothersome):  diarrhea  dizziness  hair loss  headache  irritable  loss of appetite  nausea, vomiting  stomach pain This list may not describe all possible side effects.  Call your doctor for medical advice about side effects. You may report side effects to FDA at 1-800-FDA-1088. Where should I keep my medicine? Keep out of the reach of children. Store at room temperature between 15 and 30 degrees C (59 and 86 degrees F). Protect from moisture and light. Throw away any unused medicine after the expiration date. NOTE: This sheet is a summary. It may not cover all possible information. If you have questions about this medicine, talk to your doctor, pharmacist, or health care provider.  2020 Elsevier/Gold Standard (2018-05-23 12:56:32)

## 2018-12-19 NOTE — Addendum Note (Signed)
Addended by: Earnestine Mealing on: 12/19/2018 04:10 PM   Modules accepted: Orders

## 2018-12-20 MED ORDER — HYDROXYCHLOROQUINE SULFATE 200 MG PO TABS: 200.0000 mg | ORAL_TABLET | Freq: Two times a day (BID) | ORAL | 0 refills | Status: DC

## 2018-12-20 NOTE — Addendum Note (Signed)
Addended by: Earnestine Mealing on: 12/20/2018 12:17 PM   Modules accepted: Orders

## 2019-01-11 ENCOUNTER — Telehealth: Payer: Self-pay

## 2019-01-11 ENCOUNTER — Telehealth: Payer: Self-pay | Admitting: Rheumatology

## 2019-01-11 ENCOUNTER — Other Ambulatory Visit: Payer: Self-pay

## 2019-01-11 NOTE — Telephone Encounter (Signed)
Spoke with patient and she has discontinued PLQ by recommendation of Dr. Amalia Hailey. Patient requested appointment at the beginning of January 2021 to discuss treatment options, she is scheduled for 01/30/2019.

## 2019-01-11 NOTE — Telephone Encounter (Signed)
Patient left a voicemail stating she was returning your call.   

## 2019-01-11 NOTE — Telephone Encounter (Signed)
See previous telephone encounter.

## 2019-01-11 NOTE — Telephone Encounter (Signed)
Received PLQ eye exam from Dr. Amalia Hailey office.  Carol Sams, PA-C reviewed and patient should discontinue plaquenil and schedule an appointment to discuss treatment options.   Attempted to contact patient and left message on machine for patient to call the office.

## 2019-01-16 ENCOUNTER — Telehealth: Payer: Self-pay | Admitting: Rheumatology

## 2019-01-16 NOTE — Telephone Encounter (Signed)
Patient left a voicemail requesting a return call regarding [employer] and her prescription of Cosentyx.

## 2019-01-16 NOTE — Progress Notes (Signed)
Virtual Visit via Telephone Note  I connected with Carol Horn on 01/30/19 at  1:00 PM EST by telephone and verified that I am speaking with the correct person using two identifiers.  Location: Patient: Home Provider: Clinic  This service was conducted via virtual visit.  The patient was located at home. I was located in my office.  Consent was obtained prior to the virtual visit and is aware of possible charges through their insurance for this visit.  The patient is an established patient.  Dr. Estanislado Pandy, MD conducted the virtual visit and Hazel Sams, PA-C acted as scribe during the service.  Office staff helped with scheduling follow up visits after the service was conducted.     I discussed the limitations, risks, security and privacy concerns of performing an evaluation and management service by telephone and the availability of in person appointments. I also discussed with the patient that there may be a patient responsible charge related to this service. The patient expressed understanding and agreed to proceed.  CC: pain in both knee joints History of Present Illness: Patient is a 73 year old female with past medical history of psoriatic arthritis and osteoarthritis. She is on Cosentyx 150 mg sq injections every 14 days.  She did not start on Plaquenil per recommendation of her ophthalmologist.  She has not noticed any improvement since spacing the dose of cosentyx.  She continues to have recurrent flares and ongoing chronic inflammation in multiple joints.    She has chronic pain and inflammation in both knee joints.  She states she had difficulty getting out of the bathtub yesterday due to the discomfort. She has chronic pain and inflammation in the right hand, right wrist, and right elbow joint.  She would like to discuss other treatment options.   Review of Systems  Constitutional: Positive for malaise/fatigue. Negative for fever.  HENT: Negative for congestion.   Eyes: Negative  for photophobia, pain, discharge and redness.  Respiratory: Positive for cough. Negative for wheezing.   Cardiovascular: Positive for leg swelling. Negative for chest pain and palpitations.  Gastrointestinal: Negative for blood in stool, constipation and diarrhea.  Genitourinary: Negative for dysuria.  Musculoskeletal: Positive for back pain and joint pain. Negative for myalgias and neck pain.  Skin: Positive for rash.  Neurological: Negative for dizziness and headaches.  Endo/Heme/Allergies: Does not bruise/bleed easily.  Psychiatric/Behavioral: Negative for depression. The patient is not nervous/anxious and does not have insomnia.       Observations/Objective: Physical Exam  Constitutional: She is oriented to person, place, and time.  Neurological: She is alert and oriented to person, place, and time.  Psychiatric: Mood, memory, affect and judgment normal.   Patient reports morning stiffness for 4  hours.   Patient reports nocturnal pain.  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  She has difficulty with ADLs and climbing steps and rising from a seated position due to the discomfort she experiences.  She has extensive psoriasis on the extensor surface of the right knee.  She has been injecting Cosentyx 150 mg sq injections every 14 days and has not noticed any improvement.  She did not start on plaquenil per recommendation of her ophthalmologist. We discussed switching her  to [employer].  Indications, contraindications, and potential side effects of Taltz were discussed.  All questions were addressed. She will come by the  office to sign the consent form. We will apply for Taltz through her insurance and schedule a nurse visit one approved.  She will follow up in 1 month.    Psoriasis-She has a large thick patch of psoriasis on the extensor surface of the right knee.     High risk medication use -Applying for Taltz.  She is due to update lab work today and then 1 month after starting on [place] then every 3 months.Inadequate response to Cosentyx 150 mg every  14 days (started August 2019). Last TB gold negative on 07/18/2018 and will monitor yearly. She has history of elevated LFTs-due to taking advil daily. Prior therapy: Otezla (GI upset), Enbrel, Stelara (not covered by insurance), Ophthalmologist advised against starting on Plaquenil. Hx of Hep C-treated with Harvoni in the past.  Hep C RNA quant negative on 10/24/18.   Primary osteoarthritis of both knee joints:The x-ray showed moderate osteoarthritis and moderate chondromalacia patella.  She has chronic pain and inflammation in both knee joints. She has difficulty climbing steps and getting up from a chair.      Bilateral foot pain -X-ray showed severe erosive psoriatic arthritis. She has occasional discomfort and inflammation in both feet, but she states overall her feet pain has subsided since her last visit.   Chronic left shoulder pain -X-ray was unremarkable. She has intermittent discomfort in the left shoulder joint.  Spondylosis without myelopathy or radiculopathy, lumbar region:She has multilevel spondylosis and facet joint arthropathy.  She has chronic lower back pain and stiffness.    Other medical conditions are listed as follows:   Carpal tunnel syndrome of right wrist: She has intermittent right hand paresthesias.   Chronic hepatitis C without hepatic coma (HCC)-HCV RNA quant negative on 10/24/18.  Treated with Harvoni in the past.   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  Thrombocytopenia (HCC)-recently her platelets have been  within normal limits.  Congenital absence of forearm only, left  Family history of psoriatic arthritis  Follow Up Instructions: She will follow up in 1 month.    I discussed the assessment and treatment plan with the patient. The patient was provided an opportunity to ask questions and all were answered. The patient agreed with the plan and demonstrated an understanding of the instructions.   The patient was advised to call back or seek an in-person evaluation if the symptoms worsen or if the condition fails to improve as anticipated.  I provided 25 minutes of non-face-to-face time during this encounter.   Bo Merino, MD   Scribed by-  Hazel Sams PA-C

## 2019-01-17 ENCOUNTER — Telehealth: Payer: Self-pay | Admitting: Rheumatology

## 2019-01-17 NOTE — Telephone Encounter (Signed)
Returned patient's call. She will complete application and will submit application with her [id number] income documents.Advised patient that we have already submitted the provider portion. She mail to [employer] directly.  10:02 AM Carol Horn, CPhT

## 2019-01-17 NOTE — Telephone Encounter (Signed)
Patient called stating she received her packet from [employer].  Patient is requesting a return call to let her know if she should wait until she receives her W2 in January before filling out her income portion of the form.  Patient states there is also a section regarding submitting the income electronically and has questions about that.

## 2019-01-17 NOTE — Telephone Encounter (Signed)
Patient's question was addressed in a different telephone encounter. Closing Encounter

## 2019-01-27 HISTORY — PX: VALVE REPLACEMENT: SUR13

## 2019-01-30 ENCOUNTER — Telehealth: Payer: Self-pay | Admitting: *Deleted

## 2019-01-30 ENCOUNTER — Telehealth (INDEPENDENT_AMBULATORY_CARE_PROVIDER_SITE_OTHER): Payer: Medicare Other | Admitting: Rheumatology

## 2019-01-30 ENCOUNTER — Other Ambulatory Visit: Payer: Self-pay

## 2019-01-30 ENCOUNTER — Encounter: Payer: Self-pay | Admitting: Physician Assistant

## 2019-01-30 DIAGNOSIS — Q7152 Longitudinal reduction defect of left ulna: Secondary | ICD-10-CM

## 2019-01-30 DIAGNOSIS — D472 Monoclonal gammopathy: Secondary | ICD-10-CM

## 2019-01-30 DIAGNOSIS — Z79899 Other long term (current) drug therapy: Secondary | ICD-10-CM

## 2019-01-30 DIAGNOSIS — M47816 Spondylosis without myelopathy or radiculopathy, lumbar region: Secondary | ICD-10-CM

## 2019-01-30 DIAGNOSIS — Q7142 Longitudinal reduction defect of left radius: Secondary | ICD-10-CM

## 2019-01-30 DIAGNOSIS — D696 Thrombocytopenia, unspecified: Secondary | ICD-10-CM

## 2019-01-30 DIAGNOSIS — M170 Bilateral primary osteoarthritis of knee: Secondary | ICD-10-CM | POA: Diagnosis not present

## 2019-01-30 DIAGNOSIS — L409 Psoriasis, unspecified: Secondary | ICD-10-CM

## 2019-01-30 DIAGNOSIS — Z840 Family history of diseases of the skin and subcutaneous tissue: Secondary | ICD-10-CM

## 2019-01-30 DIAGNOSIS — B182 Chronic viral hepatitis C: Secondary | ICD-10-CM

## 2019-01-30 DIAGNOSIS — Z8261 Family history of arthritis: Secondary | ICD-10-CM

## 2019-01-30 DIAGNOSIS — L4050 Arthropathic psoriasis, unspecified: Secondary | ICD-10-CM | POA: Diagnosis not present

## 2019-01-30 DIAGNOSIS — G5601 Carpal tunnel syndrome, right upper limb: Secondary | ICD-10-CM

## 2019-01-30 NOTE — Patient Instructions (Signed)
Ixekizumab injection What is this medicine? IXEKIZUMAB (ix e KIZ ue mab) is used to treat plaque psoriasis, psoriatic arthritis, ankylosing spondylitis, and active non-radiographic axial spondyloarthritis. This medicine may be used for other purposes; ask your health care provider or pharmacist if you have questions. COMMON BRAND NAME(S): TALTZ What should I tell my health care provider before I take this medicine? They need to know if you have any of these conditions:  immune system problems  infection (especially a viral infection such as chickenpox, cold sores, or herpes)  recently received or are scheduled to receive a vaccine  tuberculosis, a positive skin test for tuberculosis, or have recently been in close contact with someone who has tuberculosis  an unusual or allergic reaction to ixekizumab, other medicines, foods, dyes or preservatives  pregnant or trying to get pregnant  breast-feeding How should I use this medicine? This medicine is for injection under the skin. It may be administered by a healthcare professional in a hospital or clinic setting or at home. If you get this medicine at home, you will be taught how to prepare and give this medicine. Use exactly as directed. Take your medicine at regular intervals. Do not take your medicine more often than directed. It is important that you put your used needles and syringes in a special sharps container. Do not put them in a trash can. If you do not have a sharps container, call your pharmacist or healthcare provider to get one. A special MedGuide will be given to you by the pharmacist with each prescription and refill. Be sure to read this information carefully each time. Talk to your pediatrician regarding the use of this medicine in children. While this drug may be prescribed for children as young as 6 years for selected conditions, precautions do apply. Overdosage: If you think you have taken too much of this medicine contact  a poison control center or emergency room at once. NOTE: This medicine is only for you. Do not share this medicine with others. What if I miss a dose? It is important not to miss your dose. Call your doctor of health care professional if you are unable to keep an appointment. If you give yourself the medicine and you miss a dose, take it as soon as you can. Then be sure to take your next doses on your regular schedule. Do not take double or extra doses. If you have questions about a missed injection, call your health care professional. What may interact with this medicine? Do not take this medicine with any of the following medications:  live virus vaccines This medicine may also interact with the following medications:  cyclosporine  inactivated vaccines  warfarin This list may not describe all possible interactions. Give your health care provider a list of all the medicines, herbs, non-prescription drugs, or dietary supplements you use. Also tell them if you smoke, drink alcohol, or use illegal drugs. Some items may interact with your medicine. What should I watch for while using this medicine? Tell your doctor or healthcare professional if your symptoms do not start to get better or if they get worse. You will be tested for tuberculosis (TB) before you start this medicine. If your doctor prescribes any medicine for TB, you should start taking the TB medicine before starting this medicine. Make sure to finish the full course of TB medicine. Call your doctor or healthcare professional for advice if you get a fever, chills or sore throat, or other symptoms of a  cold or flu. Do not treat yourself. This drug decreases your body's ability to fight infections. Try to avoid being around people who are sick. °This medicine can decrease the response to a vaccine. If you need to get vaccinated, tell your healthcare professional if you have received this medicine within the last 6 months. Extra booster  doses may be needed. Talk to your doctor to see if a different vaccination schedule is needed. °What side effects may I notice from receiving this medicine? °Side effects that you should report to your doctor or health care professional as soon as possible: °· allergic reactions like skin rash, itching or hives, swelling of the face, lips, or tongue °· signs and symptoms of infection like fever or chills; cough; sore throat; pain or trouble passing urine °· signs and symptoms of bowel problems like abdominal pain, diarrhea, blood in the stool, and weight loss °· white patches in the mouth or throat °· vaginal discharge, itching, or odor in women °Side effects that usually do not require medical attention (report to your doctor or health care professional if they continue or are bothersome): °· nausea °· runny nose °· sinus trouble °This list may not describe all possible side effects. Call your doctor for medical advice about side effects. You may report side effects to FDA at 1-800-FDA-1088. °Where should I keep my medicine? °Keep out of the reach of children. °Store the prefilled syringe or injection pen in a refrigerator between 2 to 8 degrees C (36 to 46 degrees F). Keep the syringe or the pen in the original carton until ready for use. Protect from light. Do not freeze. Do not shake. Prior to use, remove the syringe or pen from the refrigerator and use within 30 minutes. Throw away any unused medicine after the expiration date on the label. °NOTE: This sheet is a summary. It may not cover all possible information. If you have questions about this medicine, talk to your doctor, pharmacist, or health care provider. °© 2020 Elsevier/Gold Standard (2018-06-28 19:35:08) ° °

## 2019-01-30 NOTE — Telephone Encounter (Signed)
Please apply for Taltz. Thank you.

## 2019-01-31 NOTE — Telephone Encounter (Signed)
Submitted a Prior Authorization request to [facility] for [place] via Cover My Meds. Will update once we receive a response.

## 2019-01-31 NOTE — Telephone Encounter (Signed)
done

## 2019-01-31 NOTE — Telephone Encounter (Signed)
Received notification from [facility] regarding a prior authorization for Carol Horn. Authorization has been APPROVED from 01/31/2019 to 01/26/2020.   Will send document to scan center.  Authorization # [id number] Phone # [phone]  Ran test claim for 1 month of Taltz- patient's copay is $1,964.51. Patient will need to apply for [facility] Patient Assistance. Please provide patient with [employer] PAP Application when she comes to sign consent form.  Called patient and advised. She has no questions or concerns at this time.  2:17 PM Carol Horn, CPhT

## 2019-02-02 NOTE — Telephone Encounter (Signed)
Creatinine is mildly elevated, most likely due to diuretic use.  Platelets are mildly decreased.  We will continue to monitor.

## 2019-02-03 LAB — COMPLETE METABOLIC PANEL WITH GFR
AG Ratio: 1.1 (calc) (ref 1.0–2.5)
ALT: 13 U/L (ref 6–29)
AST: 28 U/L (ref 10–35)
Albumin: 3.3 g/dL — ABNORMAL LOW (ref 3.6–5.1)
Alkaline phosphatase (APISO): 91 U/L (ref 37–153)
BUN/Creatinine Ratio: 19 (calc) (ref 6–22)
BUN: 19 mg/dL (ref 7–25)
CO2: 25 mmol/L (ref 20–32)
Calcium: 9.5 mg/dL (ref 8.6–10.4)
Chloride: 109 mmol/L (ref 98–110)
Creat: 0.98 mg/dL — ABNORMAL HIGH (ref 0.60–0.93)
GFR, Est African American: 66 mL/min/{1.73_m2} (ref >=60)
GFR, Est Non African American: 57 mL/min/{1.73_m2} — ABNORMAL LOW (ref >=60)
Globulin: 3 g/dL (calc) (ref 1.9–3.7)
Glucose, Bld: 113 mg/dL (ref 65–139)
Potassium: 4 mmol/L (ref 3.5–5.3)
Sodium: 144 mmol/L (ref 135–146)
Total Bilirubin: 0.8 mg/dL (ref 0.2–1.2)
Total Protein: 6.3 g/dL (ref 6.1–8.1)

## 2019-02-03 LAB — CBC WITH DIFFERENTIAL/PLATELET
Absolute Monocytes: 655 cells/uL (ref 200–950)
Basophils Absolute: 81 cells/uL (ref 0–200)
Basophils Relative: 1.4 %
Eosinophils Absolute: 162 cells/uL (ref 15–500)
Eosinophils Relative: 2.8 %
HCT: 35.7 % (ref 35.0–45.0)
Hemoglobin: 12.1 g/dL (ref 11.7–15.5)
Lymphs Abs: 1612 cells/uL (ref 850–3900)
MCH: 33 pg (ref 27.0–33.0)
MCHC: 33.9 g/dL (ref 32.0–36.0)
MCV: 97.3 fL (ref 80.0–100.0)
MPV: 10.9 fL (ref 7.5–12.5)
Monocytes Relative: 11.3 %
Neutro Abs: 3289 cells/uL (ref 1500–7800)
Neutrophils Relative %: 56.7 %
Platelets: 134 10*3/uL — ABNORMAL LOW (ref 140–400)
RBC: 3.67 10*6/uL — ABNORMAL LOW (ref 3.80–5.10)
RDW: 12.6 % (ref 11.0–15.0)
Total Lymphocyte: 27.8 %
WBC: 5.8 10*3/uL (ref 3.8–10.8)

## 2019-02-03 LAB — QUANTIFERON-TB GOLD PLUS
Mitogen-NIL: >10 IU/mL
NIL: 0.04 IU/mL
QuantiFERON-TB Gold Plus: NEGATIVE
TB1-NIL: 0 IU/mL
TB2-NIL: <0 IU/mL

## 2019-02-07 ENCOUNTER — Telehealth: Payer: Self-pay | Admitting: Pharmacy Technician

## 2019-02-07 ENCOUNTER — Telehealth: Payer: Self-pay | Admitting: Rheumatology

## 2019-02-07 NOTE — Telephone Encounter (Signed)
Patient called requesting to speak with Rachael directly regarding her TALZ application.  Patient states she has questions regarding her insurance and "a section that she isn't sure if she is suppose to fill out."

## 2019-02-07 NOTE — Telephone Encounter (Signed)
Spoke to patient today about patient assistance and she wanted to let the office know that she was recently had an appointment with [facility]. Patient states she will be scheduled for a heart catheter procedure. She will have their office send over the records of their findings.  4:15 PM Beatriz Chancellor, CPhT

## 2019-02-07 NOTE — Telephone Encounter (Signed)
Received signed taltz consent form and patient assistance application via fax from patient.

## 2019-02-07 NOTE — Telephone Encounter (Signed)
Returned patient's call. Answered questions and she will fax application to office and I will include insurance cards and fax to PAP program.

## 2019-02-07 NOTE — Telephone Encounter (Signed)
Thank you for informing me.

## 2019-02-08 ENCOUNTER — Telehealth: Payer: Self-pay | Admitting: Rheumatology

## 2019-02-08 NOTE — Telephone Encounter (Signed)
Opened in error

## 2019-02-08 NOTE — Telephone Encounter (Signed)
Spoke to patient about Carol Horn patient assistance forms. She will fax required income documents to office this week.

## 2019-02-08 NOTE — Telephone Encounter (Signed)
-----   Message from Shona Needles, RT sent at 01/30/2019  2:18 PM EST ----- Regarding: 1 MONTH F/U

## 2019-02-10 ENCOUNTER — Telehealth: Payer: Self-pay | Admitting: Rheumatology

## 2019-02-10 NOTE — Telephone Encounter (Signed)
Returned patient's call, she is having trouble looking for her tax forms. She will call back to let me know when she finds and faxes them.

## 2019-02-10 NOTE — Telephone Encounter (Signed)
Patient requesting a call in reference to Patient Assistance.

## 2019-02-21 NOTE — Telephone Encounter (Signed)
Received a fax from  [employer] regarding an approval for [place] from 02/21/2019 to 01/26/2020.   Patient ID: [id number] Phone number: [phone]  Will send document to scan center.   Mariella Saa, PharmD, [place], [place] [facility] Pharmacist [phone]  02/21/2019 11:11 AM

## 2019-02-23 ENCOUNTER — Telehealth: Payer: Self-pay | Admitting: Rheumatology

## 2019-02-23 NOTE — Telephone Encounter (Signed)
Patient left a voicemail stating she faxed her W2 form for her medication application on Saturday, 02/18/19.  Patient is requesting a return call to let her know it has been received.

## 2019-02-23 NOTE — Telephone Encounter (Signed)
Called patient, left message that she was approved for [employer] patient assistance through 01/26/20. She should received letter in the mail.

## 2019-03-01 ENCOUNTER — Telehealth: Payer: Self-pay | Admitting: Rheumatology

## 2019-03-01 NOTE — Telephone Encounter (Signed)
Patient left a voicemail requesting to speak with Rachael directly.

## 2019-03-01 NOTE — Telephone Encounter (Signed)
Returned patient's call. She advised she received approval from [employer] and wanted to confirm we received her updated income documents. Advised patient, we did and we faxed them to [facility]. Awaiting response.  Phone#[phone]  4:04 PM Carol Horn, CPhT

## 2019-03-03 ENCOUNTER — Ambulatory Visit: Payer: Medicare Other | Admitting: Rheumatology

## 2019-03-08 NOTE — Telephone Encounter (Signed)
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 Coverage is from 03/08/19 to 01/26/20. Approval letter will be faxed to the office  Phone# [phone] Fax# [phone]  Called patient to advise, she had her last Cosentyx injection on Friday 03/03/2019. Amber Rph says patient can be scheduled on 04/03/19 or after.  Advised her that office will contact her about scheduling Taltz new start visit and advise on holding the Cosentyx.  9:54 AM Beatriz Chancellor, CPhT

## 2019-03-09 NOTE — Telephone Encounter (Signed)
Patient returned call.  Scheduled Taltz new start appointment 04/03/2019 at 10:30AM.

## 2019-03-09 NOTE — Telephone Encounter (Signed)
Attempted to contact the patient and unable to leave a message.  

## 2019-03-20 ENCOUNTER — Telehealth: Payer: Self-pay | Admitting: Rheumatology

## 2019-03-20 NOTE — Progress Notes (Deleted)
Office Visit Note  Patient: Carol Horn             Date of Birth: 11/19/1945           MRN: [id number]             PCP: Chalmers Guest, [place] Referring: Chalmers Guest, FNP Visit Date: 03/23/2019 Occupation: @GUAROCC @  Subjective:  No chief complaint on file.   History of Present Illness: Carol Horn is a 74 y.o. female ***   Activities of Daily Living:  Patient reports morning stiffness for *** {minute/hour:19697}.   Patient {ACTIONS;DENIES/REPORTS:21021675::"Denies"} nocturnal pain.  Difficulty dressing/grooming: {ACTIONS;DENIES/REPORTS:21021675::"Denies"} Difficulty climbing stairs: {ACTIONS;DENIES/REPORTS:21021675::"Denies"} Difficulty getting out of chair: {ACTIONS;DENIES/REPORTS:21021675::"Denies"} Difficulty using hands for taps, buttons, cutlery, and/or writing: {ACTIONS;DENIES/REPORTS:21021675::"Denies"}  No Rheumatology ROS completed.   PMFS History:  Patient Active Problem List   Diagnosis Date Noted  . Thrombocytopenia ([place]) 07/27/2016  . Psoriasis 06/23/2016  . 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 High risk medication use 06/23/2016  . Chronic hepatitis C without hepatic coma ([place]) 06/23/2016    No past medical history on file.  Family History  Problem Relation Age of Onset  . Alcohol abuse Mother   . Cirrhosis Mother   . Alcohol abuse Father   . Cirrhosis Father   . Heart attack Sister   . Celiac disease Daughter    Past Surgical History:  Procedure Laterality Date  . EYE SURGERY Bilateral 04/2017 05/2017   cataract extraction   . MOLE REMOVAL     on back   . TYMPANOSTOMY TUBE PLACEMENT Right 05/2017   Social History   Social History Narrative  . Not on file   Immunization History  Administered Date(s) Administered  . Hep A / Hep B 11/24/2016, 07/22/2017  . Hepatitis B 12/22/2016  . Influenza, High Dose Seasonal PF 10/09/2014, 10/29/2015, 11/24/2016, 11/23/2017  . PPD Test 11/19/2014  . Pneumococcal Conjugate-13  09/10/2014  . Pneumococcal Polysaccharide-23 08/16/2012  . Zoster 04/12/2012     Objective: Vital Signs: There were no vitals taken for this visit.   Physical Exam   Musculoskeletal Exam: ***  CDAI Exam: CDAI Score: -- Patient Global: --; Provider Global: -- Swollen: --; Tender: -- Joint Exam 03/23/2019   No joint exam has been documented for this visit   There is currently no information documented on the homunculus. Go to the Rheumatology activity and complete the homunculus joint exam.  Investigation: No additional findings.  Imaging: No results found.  Recent Labs: Lab Results  Component Value Date   WBC 5.8 02/01/2019   HGB 12.1 02/01/2019   PLT 134 (L) 02/01/2019   NA 144 02/01/2019   K 4.0 02/01/2019   CL 109 02/01/2019   CO2 25 02/01/2019   GLUCOSE 113 02/01/2019   BUN 19 02/01/2019   CREATININE 0.98 (H) 02/01/2019   BILITOT 0.8 02/01/2019   ALKPHOS 112 09/01/2016   AST 28 02/01/2019   ALT 13 02/01/2019   PROT 6.3 02/01/2019   ALBUMIN 3.5 09/01/2016   CALCIUM 9.5 02/01/2019   GFRAA 66 02/01/2019   QFTBGOLDPLUS NEGATIVE 02/01/2019    Speciality Comments: Prior therapy: Rutherford Nail (GI upset), Enbrel (inadequate response), Stelara (cost), PLQ (discontinued per Dr. Amalia Hailey- ophthalmologist)  Procedures:  No procedures performed Allergies: Amoxicillin, Hydrochlorothiazide, and Stelara [ustekinumab]   Assessment / Plan:     Visit Diagnoses: No diagnosis found.  Orders: No orders of the defined types were placed in this encounter.  No orders of the defined types were placed in  this encounter.   Face-to-face time spent with patient was *** minutes. Greater than 50% of time was spent in counseling and coordination of care.  Follow-Up Instructions: No follow-ups on file.   Earnestine Mealing, CMA  Note - This record has been created using Editor, commissioning.  Chart creation errors have been sought, but may not always  have been located. Such creation  errors do not reflect on  the standard of medical care.

## 2019-03-20 NOTE — Telephone Encounter (Signed)
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    Patient states she is also scheduled for follow-up appointment on 03/23/19 and Taltz appointment on 04/03/19 and is asking if those two appointments could be combined.

## 2019-03-20 NOTE — Telephone Encounter (Signed)
Patient advised to clarify with her cardiothoracic surgeon how long she should wait before starting on immunosuppressive therapy.  She will need to be cleared by cardio prior to starting on Taltz.  She can schedule a combined office visit and new start visit once cleared.

## 2019-03-20 NOTE — Telephone Encounter (Signed)
Please advise patient to clarify with her cardiothoracic surgeon how long she should wait before starting on immunosuppressive therapy.  She will need to be cleared by cardio prior to starting on Taltz.  She can schedule a combined office visit and new start visit once cleared.

## 2019-03-23 ENCOUNTER — Ambulatory Visit: Payer: Medicare Other | Admitting: Rheumatology

## 2019-03-27 NOTE — Progress Notes (Deleted)
Pharmacy Note  Subjective:   Patient is being initiated on ***.  Patient was previously counseled extensively on and consented to initiation of *** at that time.  Patient presents to clinic today to receive the first dose of ***.     Objective: CMP     Component Value Date/Time   NA 144 02/01/2019 1534   NA 142 09/01/2016 1545   K 4.0 02/01/2019 1534   K 3.3 (L) 09/01/2016 1545   CL 109 02/01/2019 1534   CO2 25 02/01/2019 1534   CO2 21 (L) 09/01/2016 1545   GLUCOSE 113 02/01/2019 1534   GLUCOSE 83 09/01/2016 1545   BUN 19 02/01/2019 1534   BUN 10.7 09/01/2016 1545   CREATININE 0.98 (H) 02/01/2019 1534   CREATININE 1.0 09/01/2016 1545   CALCIUM 9.5 02/01/2019 1534   CALCIUM 9.7 09/01/2016 1545   PROT 6.3 02/01/2019 1534   PROT 7.6 09/01/2016 1545   PROT 8.0 09/01/2016 1545   ALBUMIN 3.5 09/01/2016 1545   AST 28 02/01/2019 1534   AST 75 (H) 09/01/2016 1545   ALT 13 02/01/2019 1534   ALT 40 09/01/2016 1545   ALKPHOS 112 09/01/2016 1545   BILITOT 0.8 02/01/2019 1534   BILITOT 1.45 (H) 09/01/2016 1545   GFRNONAA 57 (L) 02/01/2019 1534   GFRAA 66 02/01/2019 1534    CBC    Component Value Date/Time   WBC 5.8 02/01/2019 1534   RBC 3.67 (L) 02/01/2019 1534   HGB 12.1 02/01/2019 1534   HGB 13.0 09/01/2016 1545   HCT 35.7 02/01/2019 1534   HCT 38.2 09/01/2016 1545   PLT 134 (L) 02/01/2019 1534   PLT 160 09/01/2016 1545   MCV 97.3 02/01/2019 1534   MCV 100.3 09/01/2016 1545   MCH 33.0 02/01/2019 1534   MCHC 33.9 02/01/2019 1534   RDW 12.6 02/01/2019 1534   RDW 13.6 09/01/2016 1545   LYMPHSABS 1,612 02/01/2019 1534   LYMPHSABS 1.9 09/01/2016 1545   MONOABS 0.7 09/01/2016 1545   EOSABS 162 02/01/2019 1534   EOSABS 0.1 09/01/2016 1545   BASOSABS 81 02/01/2019 1534   BASOSABS 0.1 09/01/2016 1545    Baseline Immunosuppressant Therapy Labs TB GOLD Quantiferon TB Gold Latest Ref Rng & Units 02/01/2019  Quantiferon TB Gold Plus NEGATIVE NEGATIVE   Hepatitis Panel    HIV Lab Results  Component Value Date   HIV NONREACTIVE 06/23/2016   Immunoglobulins Immunoglobulin Electrophoresis Latest Ref Rng & Units 09/01/2016  IgG 700 - 1600 mg/dL 2,363(H)  IgM 26 - 217 mg/dL 152   SPEP Serum Protein Electrophoresis Latest Ref Rng & Units 02/01/2019  Total Protein 6.1 - 8.1 g/dL 6.3  Albumin 3.8 - 4.8 g/dL -  Alpha-1 0.2 - 0.3 g/dL -  Alpha-2 0.5 - 0.9 g/dL -  Beta Globulin 0.4 - 0.6 g/dL -  Beta 2 0.2 - 0.5 g/dL -  Gamma Globulin 0.8 - 1.7 g/dL -  Interpretation - -   G6PD No results found for: G6PDH TPMT No results found for: TPMT   Chest x-ray: ***  Patient running a fever or have signs/symptoms of infection? {yes/no:20286}  Assessment/Plan:  Demonstrated proper injection technique with **** demo pen.  Patient able to demonstrate proper injection technique using the teach back method.  Patient self injected in the **** with:  Sample Medication: *** NDC: *** Lot: *** Expiration: **  Patient tolerated well.  Observed for 30 mins in office for adverse reaction and ***.   Patient is to return in ***  for labs.  Standing orders placed. Prescription sent to *** per patient's request.  All questions encouraged and answered.  Instructed patient to call with any further questions or concerns.  Mariella Saa, PharmD, [facility] Rheumatology Clinical Pharmacist  03/27/2019 11:35 AM

## 2019-03-28 ENCOUNTER — Telehealth: Payer: Self-pay | Admitting: Rheumatology

## 2019-03-28 DIAGNOSIS — L409 Psoriasis, unspecified: Secondary | ICD-10-CM

## 2019-03-28 DIAGNOSIS — L4050 Arthropathic psoriasis, unspecified: Secondary | ICD-10-CM

## 2019-03-28 NOTE — Telephone Encounter (Signed)
Returned patient call.  Patient is having increased pain after being off immunosuppressive therapy for her cardiac procedure.  She wants to know when she can start [place].  Advised patient that we cannot initiate Taltz until we have clearance from her cardiologist.  Patient states cardiologist was aware that she was previously on Cosentyx and stated it should not be an issue.  Requested patient to have cardiologist call or fax to the office for official clearance.  Explained that we do not want to increase her risk of infection or impair wound healing.  Patient verbalized understanding.  Instructed patient to call our office once we receive clearance to schedule a follow-up appointment with Dr. Estanislado Pandy and we can initiate Taltz then.   Mariella Saa, PharmD, [place], [place] Clinical Specialty Pharmacist [phone]  03/28/2019 1:23 PM

## 2019-03-28 NOTE — Telephone Encounter (Signed)
Patient called requesting to speak with Rachael and/or Amber directly regarding "her new medication."    Patient requested a return call.

## 2019-04-03 ENCOUNTER — Institutional Professional Consult (permissible substitution): Payer: Medicare Other

## 2019-04-03 MED ORDER — TALTZ 80 MG/ML ~~LOC~~ SOAJ: 80.0000 mg | SUBCUTANEOUS | 0 refills | Status: DC

## 2019-04-03 NOTE — Addendum Note (Signed)
Addended by: Mariella Saa C on: 04/03/2019 09:14 AM   Modules accepted: Orders

## 2019-04-03 NOTE — Telephone Encounter (Addendum)
Received voicemail from patient while out at the office on Friday.  She called to notify that Donnetta Hail had reached out to her and she scheduled her for shipment of [place].  She wanted to know if she could go ahead and start Taltz as her symptoms were unbearable.  She also notified that her provider reached out to our office giving clearance to start [place].  Return patient call.  Patient received her first shipment on Friday and went ahead and administered her first dose of [name].  She apologized for not adhering to standard protocols for starting biologic medication.  Patient states that she did have an injection site reaction after her first injection but it is tolerable.  Reviewed injection technique recommendations specific to Taltz pen device such as not applying too much pressure and having pain flush against the skin to reduce the occurrence of injection site reaction.  Patient verbalized understanding.  Verified Taltz dosing schedule.  She injected 160 mg (2 pens) on Saturday and then will inject 80 mg (1 pen) in 2 weeks then 4 weeks.  Patient was transferred to the front desk to schedule a 1 month follow-up visit after starting [name].  Mariella Saa, PharmD, [place], [place] Clinical Specialty Pharmacist [phone]  04/03/2019 9:01 AM

## 2019-04-20 ENCOUNTER — Other Ambulatory Visit: Payer: Self-pay | Admitting: Rheumatology

## 2019-04-20 DIAGNOSIS — L409 Psoriasis, unspecified: Secondary | ICD-10-CM

## 2019-04-20 DIAGNOSIS — L4050 Arthropathic psoriasis, unspecified: Secondary | ICD-10-CM

## 2019-04-20 NOTE — Telephone Encounter (Signed)
Last Visit: 01/30/19 Next Visit: 05/03/19 Labs: 02/01/19 Creatinine is mildly elevated Platelets are mildly decreased. We will continue to monitor. Tb Gold: 02/01/19 Neg   Reviewed dosing and the number of pens needed with Amber Yopp in office pharmacist.   Faythe Ghee to refill per Dr. Estanislado Pandy

## 2019-05-01 NOTE — Progress Notes (Deleted)
Office Visit Note  Patient: Carol Horn             Date of Birth: 01-28-1945           MRN: [id number]             PCP: Chalmers Guest, [place] Referring: Chalmers Guest, FNP Visit Date: 05/03/2019 Occupation: @GUAROCC @  Subjective:  No chief complaint on file.   History of Present Illness: Carol Horn is a 74 y.o. female ***   Activities of Daily Living:  Patient reports morning stiffness for *** {minute/hour:19697}.   Patient {ACTIONS;DENIES/REPORTS:21021675::"Denies"} nocturnal pain.  Difficulty dressing/grooming: {ACTIONS;DENIES/REPORTS:21021675::"Denies"} Difficulty climbing stairs: {ACTIONS;DENIES/REPORTS:21021675::"Denies"} Difficulty getting out of chair: {ACTIONS;DENIES/REPORTS:21021675::"Denies"} Difficulty using hands for taps, buttons, cutlery, and/or writing: {ACTIONS;DENIES/REPORTS:21021675::"Denies"}  No Rheumatology ROS completed.   PMFS History:  Patient Active Problem List   Diagnosis Date Noted  . Thrombocytopenia ([place]) 07/27/2016  . Psoriasis 06/23/2016  . 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 High risk medication use 06/23/2016  . Chronic hepatitis C without hepatic coma ([place]) 06/23/2016    No past medical history on file.  Family History  Problem Relation Age of Onset  . Alcohol abuse Mother   . Cirrhosis Mother   . Alcohol abuse Father   . Cirrhosis Father   . Heart attack Sister   . Celiac disease Daughter    Past Surgical History:  Procedure Laterality Date  . EYE SURGERY Bilateral 04/2017 05/2017   cataract extraction   . MOLE REMOVAL     on back   . TYMPANOSTOMY TUBE PLACEMENT Right 05/2017   Social History   Social History Narrative  . Not on file   Immunization History  Administered Date(s) Administered  . Hep A / Hep B 11/24/2016, 07/22/2017  . Hepatitis B 12/22/2016  . Influenza, High Dose Seasonal PF 10/09/2014, 10/29/2015, 11/24/2016, 11/23/2017  . PPD Test 11/19/2014  . Pneumococcal Conjugate-13  09/10/2014  . Pneumococcal Polysaccharide-23 08/16/2012  . Zoster 04/12/2012     Objective: Vital Signs: There were no vitals taken for this visit.   Physical Exam   Musculoskeletal Exam: ***  CDAI Exam: CDAI Score: -- Patient Global: --; Provider Global: -- Swollen: --; Tender: -- Joint Exam 05/03/2019   No joint exam has been documented for this visit   There is currently no information documented on the homunculus. Go to the Rheumatology activity and complete the homunculus joint exam.  Investigation: No additional findings.  Imaging: No results found.  Recent Labs: Lab Results  Component Value Date   WBC 5.8 02/01/2019   HGB 12.1 02/01/2019   PLT 134 (L) 02/01/2019   NA 144 02/01/2019   K 4.0 02/01/2019   CL 109 02/01/2019   CO2 25 02/01/2019   GLUCOSE 113 02/01/2019   BUN 19 02/01/2019   CREATININE 0.98 (H) 02/01/2019   BILITOT 0.8 02/01/2019   ALKPHOS 112 09/01/2016   AST 28 02/01/2019   ALT 13 02/01/2019   PROT 6.3 02/01/2019   ALBUMIN 3.5 09/01/2016   CALCIUM 9.5 02/01/2019   GFRAA 66 02/01/2019   QFTBGOLDPLUS NEGATIVE 02/01/2019    Speciality Comments: Prior therapy: Rutherford Nail (GI upset), Enbrel (inadequate response), Stelara (cost), PLQ (discontinued per Dr. Amalia Hailey- ophthalmologist)  Procedures:  No procedures performed Allergies: Amoxicillin, Hydrochlorothiazide, and Stelara [ustekinumab]   Assessment / Plan:     Visit Diagnoses: No diagnosis found.  Orders: No orders of the defined types were placed in this encounter.  No orders of the defined types were placed in  this encounter.   Face-to-face time spent with patient was *** minutes. Greater than 50% of time was spent in counseling and coordination of care.  Follow-Up Instructions: No follow-ups on file.   Earnestine Mealing, CMA  Note - This record has been created using Editor, commissioning.  Chart creation errors have been sought, but may not always  have been located. Such creation  errors do not reflect on  the standard of medical care.

## 2019-05-03 ENCOUNTER — Ambulatory Visit: Payer: Medicare Other | Admitting: Physician Assistant

## 2019-05-10 IMAGING — US US ABDOMEN COMPLETE W/ ELASTOGRAPHY
1 series · 13 of 25 positions shown · non-contrast
Comparison: Ultrasound 05/30/2007

CLINICAL DATA: Hepatitis-C 15 years



[Series 1: us abdomen complete w/ elastography · 0.20mm/px · 13 of 98 slices shown]
[im 1/98]
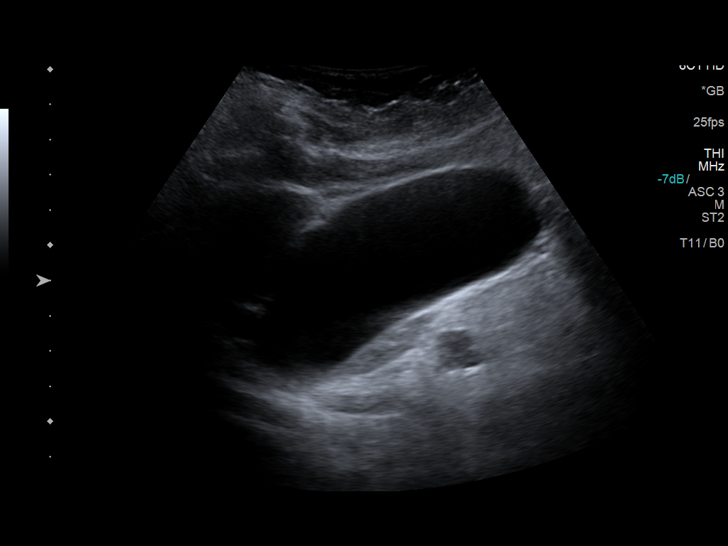
[im 9/98]
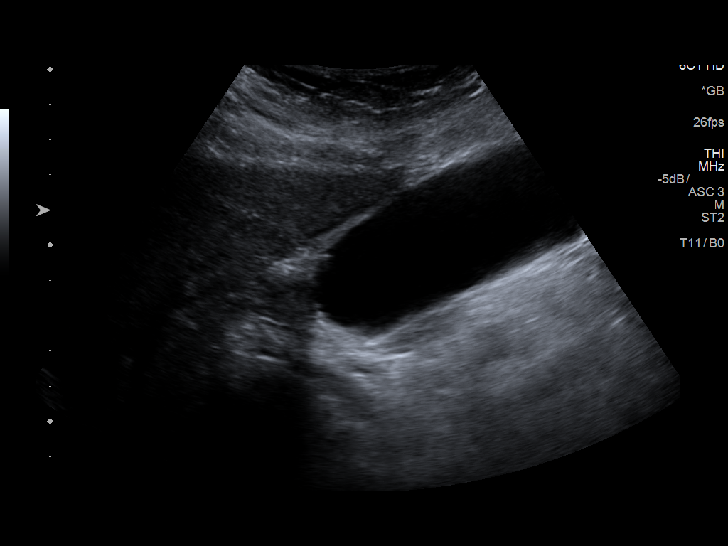
[im 17/98]
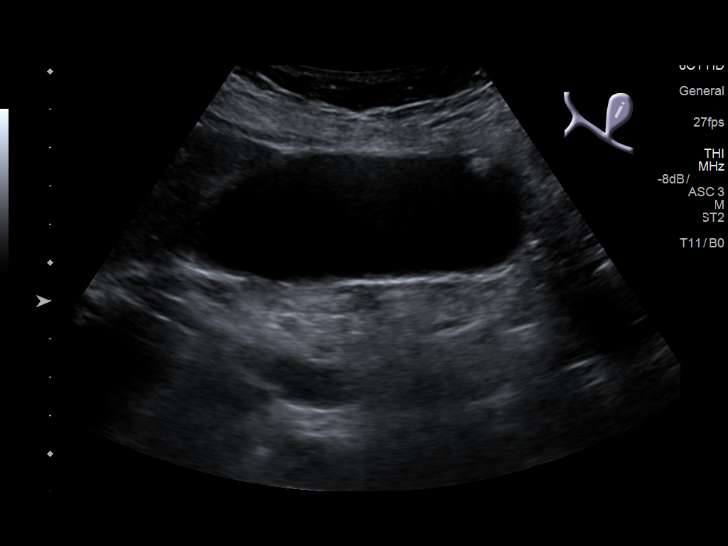
[im 25/98]
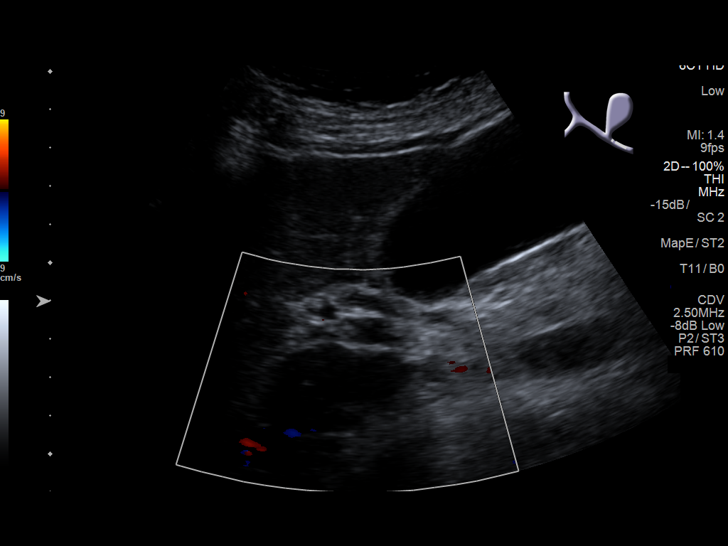
[im 33/98]
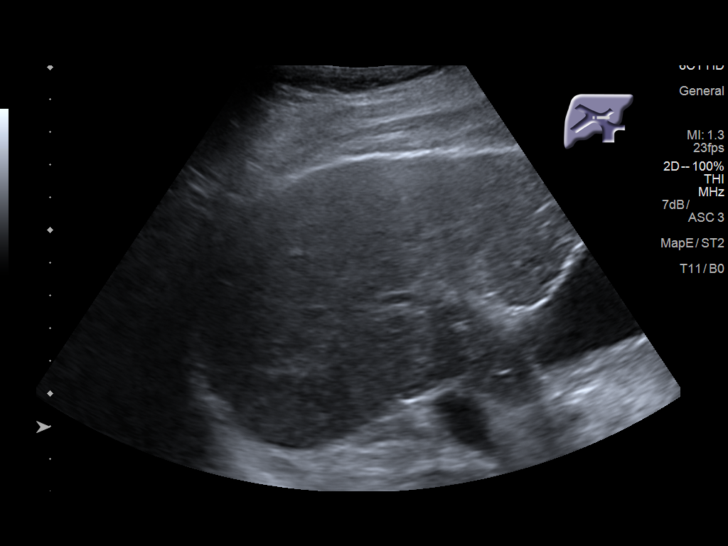
[im 41/98]
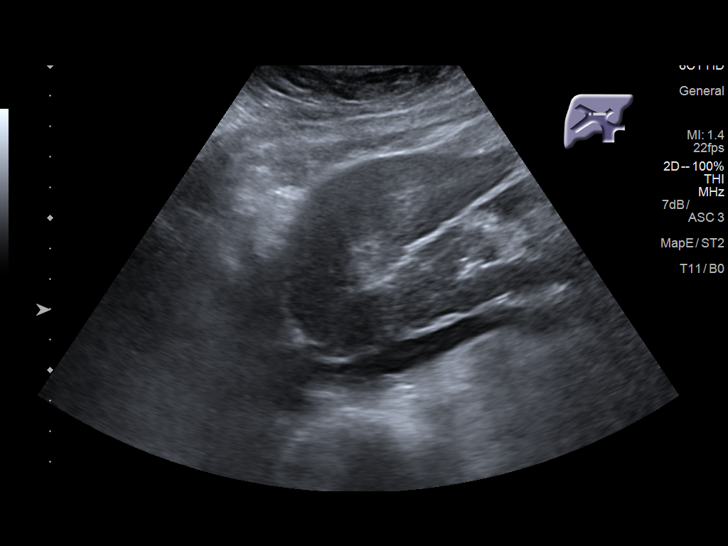
[im 49/98]
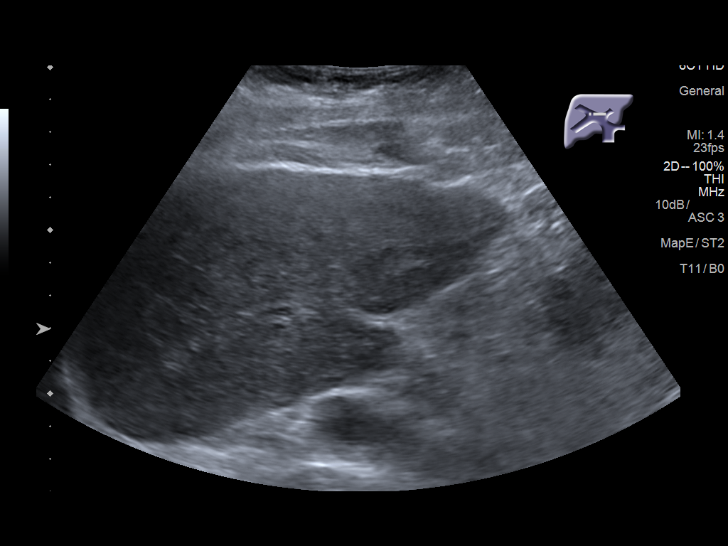
[im 57/98]
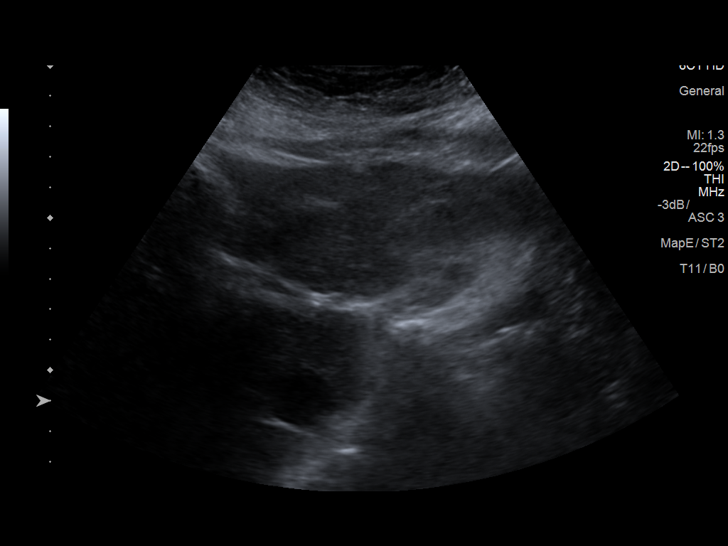
[im 65/98]
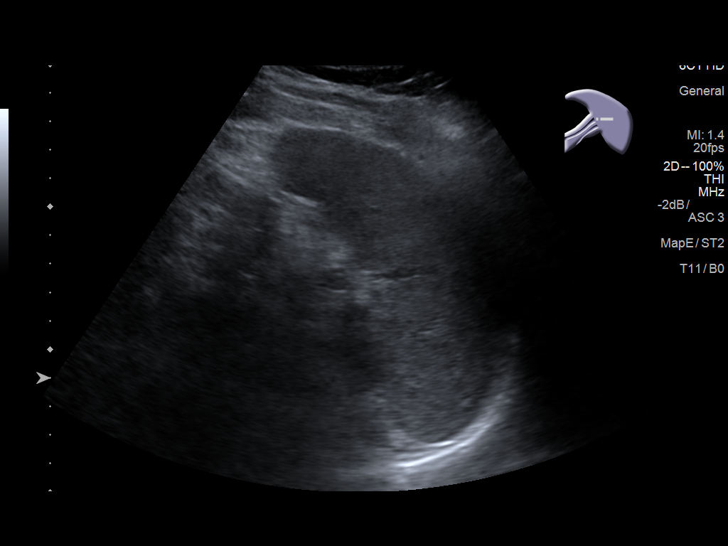
[im 73/98]
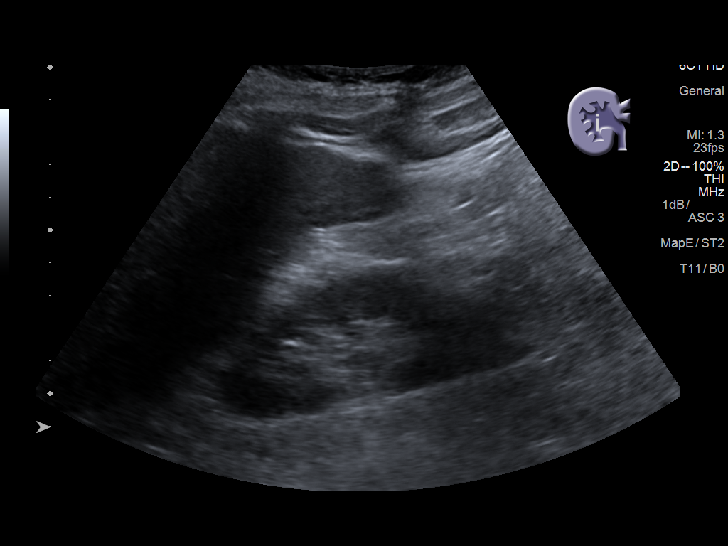
[im 81/98]
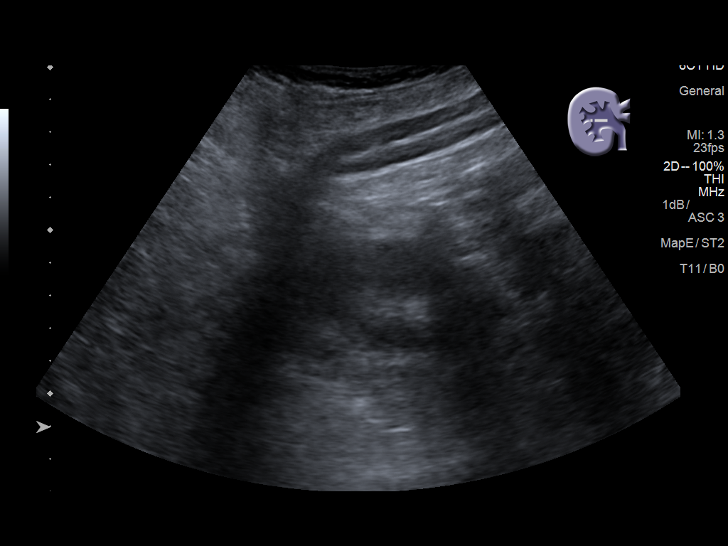
[im 89/98]
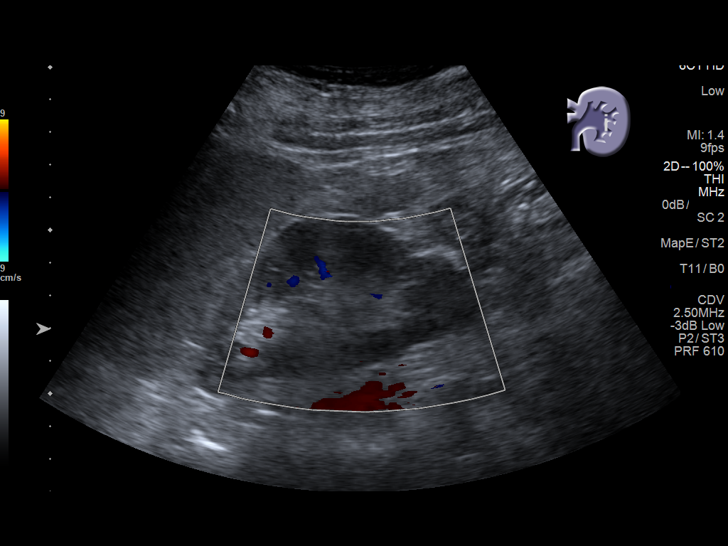
[im 98/98]
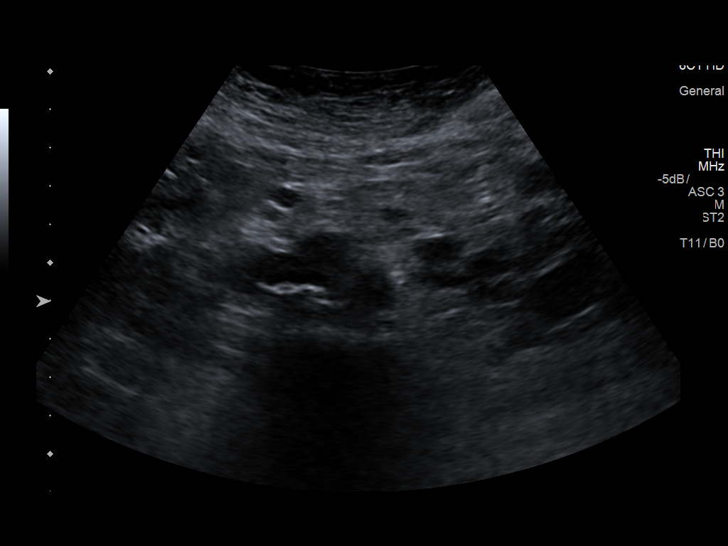

[13 of 25 positions shown; findings below may reference images not displayed]

FINDINGS: ULTRASOUND ABDOMEN

Gallbladder: No gallstones or wall thickening visualized. No
sonographic Murphy sign noted by sonographer.

Common bile duct: Diameter: Normal at 4 mm

Liver: Heterogeneous echotexture with lobular contour. No focal
lesion. Portal vein is patent on color Doppler imaging with normal
direction of blood flow towards the liver.

IVC: No abnormality visualized.

Pancreas: Visualized portion unremarkable.

Spleen: Size and appearance within normal limits.

Right Kidney: Length: 9.6 cm. Echogenicity within normal limits. No
mass or hydronephrosis visualized.

Left Kidney: Length: 9.5 cm. Echogenicity within normal limits. No
mass or hydronephrosis visualized.

Abdominal aorta: No aneurysm visualized.

Other findings: None.

ULTRASOUND HEPATIC ELASTOGRAPHY

Device: Siemens Helix VTQ

Patient position: Left Lateral Decubitus

Transducer 6C1

Number of measurements: 10

Hepatic segment:  8

Median velocity:   2.49  m/sec

IQR:

IQR/Median velocity ratio:

Corresponding Metavir fibrosis score:  Some F3 + F4

Risk of fibrosis: High

Limitations of exam: None

Pertinent findings noted on other imaging exams: Echogenic liver
with lobular contour.

Please note that abnormal shear wave velocities may also be
identified in clinical settings other than with hepatic fibrosis,
such as: acute hepatitis, elevated right heart and central venous
pressures including use of beta blockers, Jeantholet disease
([place]), infiltrative processes such as
mastocytosis/amyloidosis/infiltrative tumor, extrahepatic
cholestasis, in the post-prandial state, and liver transplantation.
Correlation with patient history, laboratory data, and clinical
condition recommended.
IMPRESSION: ULTRASOUND ABDOMEN:

1. No acute abdominal findings.
2. Echogenic liver with lobular contour.  No focal lesion.

ULTRASOUND HEPATIC ELASTOGRAPHY:

Median hepatic shear wave velocity is calculated at 2.49 m/sec.

Corresponding Metavir fibrosis score is  Some F3 + F4.

Risk of fibrosis is Moderate.

Follow-up: Follow up advised

## 2019-05-11 ENCOUNTER — Telehealth: Payer: Self-pay | Admitting: Rheumatology

## 2019-05-11 NOTE — Telephone Encounter (Signed)
Patient called stating she has not received her prescription refill of Taltz from the specialty pharmacy.  Patient states she took her last injection on 04/27/19 and is due to take her next injection soon.  Patient is requesting a return call.

## 2019-05-12 ENCOUNTER — Other Ambulatory Visit: Payer: Self-pay | Admitting: Pharmacist

## 2019-05-12 DIAGNOSIS — L4050 Arthropathic psoriasis, unspecified: Secondary | ICD-10-CM

## 2019-05-12 DIAGNOSIS — L409 Psoriasis, unspecified: Secondary | ICD-10-CM

## 2019-05-12 MED ORDER — TALTZ 80 MG/ML ~~LOC~~ SOAJ: 80.0000 mg | SUBCUTANEOUS | 0 refills | Status: AC

## 2019-05-12 NOTE — Progress Notes (Signed)
Patient is up to date with labs.  Last CBC/CMP stable on 03/27/2019 from [facility].    Called to discuss dosing schedule with patient.  She states her first shipment she only received 4 pens.  Her last injection was 4/2 which was week 4.  Patient has 8 weeks remaining of loading dose.  Advised patient I would send in a refill for 4 pens with the same directions and no refills.  Advised she will start injecting 1 pen every 28 days with her next prescription.  Patient verbalized understanding.  Patient is due for her next injection today.  Advised patient she can stop by the office for a sample in order to prevent any delays in therapy.  Patient states she has to work today but may come by the office on Monday if she is unable to schedule her shipment.  Prescription sent to [facility].  Patient given the number to [facility] to schedule next shipment.  Mariella Saa, PharmD, [place], [place] Clinical Specialty Pharmacist [phone]  05/12/2019 10:37 AM

## 2019-05-12 NOTE — Telephone Encounter (Signed)
Called [facility] states patient needs refills sent in for [employer].  Phone# [phone]

## 2019-05-12 NOTE — Telephone Encounter (Signed)
Patient is up to date with labs.  Last CBC/CMP stable on 03/27/2019 from [facility].    Called to discuss dosing schedule with patient.  She states her first shipment she only received 4 pens.  Her last injection was 4/2 which was week 4.  Patient has 8 weeks remaining of loading dose.  Advised patient I would send in a refill for 4 pens with the same directions and no refills.  Advised she will start injecting 1 pen every 28 days with her next prescription.  Patient verbalized understanding.  Patient is due for her next injection today.  Advised patient she can stop by the office for a sample in order to prevent any delays in therapy.  Patient states she has to work today but may come by the office on Monday if she is unable to schedule her shipment.  Prescription sent to [facility].  Patient given the number to [place] to schedule next shipment.  Mariella Saa, PharmD, [place], [place] Clinical Specialty Pharmacist [phone]  05/12/2019 10:37 AM

## 2019-05-18 ENCOUNTER — Telehealth: Payer: Self-pay | Admitting: Rheumatology

## 2019-05-18 NOTE — Telephone Encounter (Signed)
Patient states she called [facility] this morning to schedule delivery for her Taltz.  Patient states they need a new prescription from Dr. Estanislado Pandy before they will be able to schedule delivery.  Patient states the prescription on file was just for initial dosing.  Patient is requesting a return call.

## 2019-05-18 NOTE — Telephone Encounter (Signed)
[facility].  They were requesting a maintenance prescription.  Advised patient has not completed her loading dose.  States they have a prescription on file for the rest of her loading dose and is ready for shipment.  Advised pharmacy that we we will send in maintenance dose pending next lab results.  Pharmacist verbalized understanding.  Returned patient call and informed that her prescription is ready for shipment and to call the pharmacy.  Patient verbalized understanding.   Mariella Saa, PharmD, [place], [place] Clinical Specialty Pharmacist [phone]  05/18/2019 10:36 AM

## 2019-06-01 NOTE — Progress Notes (Deleted)
Office Visit Note  Patient: Carol Horn             Date of Birth: Feb 19, 1945           MRN: [id number]             PCP: Chalmers Guest, [place] Referring: Chalmers Guest, FNP Visit Date: 06/12/2019 Occupation: @GUAROCC @  Subjective:  No chief complaint on file.   History of Present Illness: Carol Horn is a 74 y.o. female ***   Activities of Daily Living:  Patient reports morning stiffness for *** {minute/hour:19697}.   Patient {ACTIONS;DENIES/REPORTS:21021675::"Denies"} nocturnal pain.  Difficulty dressing/grooming: {ACTIONS;DENIES/REPORTS:21021675::"Denies"} Difficulty climbing stairs: {ACTIONS;DENIES/REPORTS:21021675::"Denies"} Difficulty getting out of chair: {ACTIONS;DENIES/REPORTS:21021675::"Denies"} Difficulty using hands for taps, buttons, cutlery, and/or writing: {ACTIONS;DENIES/REPORTS:21021675::"Denies"}  No Rheumatology ROS completed.   PMFS History:  Patient Active Problem List   Diagnosis Date Noted  . Thrombocytopenia ([place]) 07/27/2016  . Psoriasis 06/23/2016  . 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 High risk medication use 06/23/2016  . Chronic hepatitis C without hepatic coma ([place]) 06/23/2016    No past medical history on file.  Family History  Problem Relation Age of Onset  . Alcohol abuse Mother   . Cirrhosis Mother   . Alcohol abuse Father   . Cirrhosis Father   . Heart attack Sister   . Celiac disease Daughter    Past Surgical History:  Procedure Laterality Date  . EYE SURGERY Bilateral 04/2017 05/2017   cataract extraction   . MOLE REMOVAL     on back   . TYMPANOSTOMY TUBE PLACEMENT Right 05/2017   Social History   Social History Narrative  . Not on file   Immunization History  Administered Date(s) Administered  . Hep A / Hep B 11/24/2016, 07/22/2017  . Hepatitis B 12/22/2016  . Influenza, High Dose Seasonal PF 10/09/2014, 10/29/2015, 11/24/2016, 11/23/2017  . PPD Test 11/19/2014  . Pneumococcal Conjugate-13  09/10/2014  . Pneumococcal Polysaccharide-23 08/16/2012  . Zoster 04/12/2012     Objective: Vital Signs: There were no vitals taken for this visit.   Physical Exam   Musculoskeletal Exam: ***  CDAI Exam: CDAI Score: -- Patient Global: --; Provider Global: -- Swollen: --; Tender: -- Joint Exam 06/12/2019   No joint exam has been documented for this visit   There is currently no information documented on the homunculus. Go to the Rheumatology activity and complete the homunculus joint exam.  Investigation: No additional findings.  Imaging: No results found.  Recent Labs: Lab Results  Component Value Date   WBC 5.8 02/01/2019   HGB 12.1 02/01/2019   PLT 134 (L) 02/01/2019   NA 144 02/01/2019   K 4.0 02/01/2019   CL 109 02/01/2019   CO2 25 02/01/2019   GLUCOSE 113 02/01/2019   BUN 19 02/01/2019   CREATININE 0.98 (H) 02/01/2019   BILITOT 0.8 02/01/2019   ALKPHOS 112 09/01/2016   AST 28 02/01/2019   ALT 13 02/01/2019   PROT 6.3 02/01/2019   ALBUMIN 3.5 09/01/2016   CALCIUM 9.5 02/01/2019   GFRAA 66 02/01/2019   QFTBGOLDPLUS NEGATIVE 02/01/2019    Speciality Comments: Prior therapy: Rutherford Nail (GI upset), Enbrel (inadequate response), Stelara (cost), PLQ (discontinued per Dr. Amalia Hailey- ophthalmologist)  Procedures:  No procedures performed Allergies: Amoxicillin, Hydrochlorothiazide, and Stelara [ustekinumab]   Assessment / Plan:     Visit Diagnoses: No diagnosis found.  Orders: No orders of the defined types were placed in this encounter.  No orders of the defined types were placed in  this encounter.   Face-to-face time spent with patient was *** minutes. Greater than 50% of time was spent in counseling and coordination of care.  Follow-Up Instructions: No follow-ups on file.   Earnestine Mealing, CMA  Note - This record has been created using Editor, commissioning.  Chart creation errors have been sought, but may not always  have been located. Such creation  errors do not reflect on  the standard of medical care.

## 2019-06-07 ENCOUNTER — Telehealth: Payer: Self-pay | Admitting: Rheumatology

## 2019-06-07 NOTE — Telephone Encounter (Signed)
Attempted to contact the patient. Unable to leave a message, mailbox is full.  

## 2019-06-07 NOTE — Telephone Encounter (Signed)
Patient returned call to the office. Patient advised she should stop Taltz 1 month prior to the surgery and may be able to restart 2 weeks later if there is no infection and healing is going well.  She will need clearance to restart Taltz from her surgeon. Patient states she took her last injection on 06/01/2019. Patient advised per Dr. Estanislado Pandy she should contact her surgeon and it will be the surgeon's call as to whether to go ahead with the surgery or to postpone. Patient advised Dr. Estanislado Pandy recommends holding Donnetta Hail for 1 month prior to surgery. Patient has her pre-op appointment tomorrow and will ask then.

## 2019-06-07 NOTE — Telephone Encounter (Signed)
She should stop Toltz 1 month prior to the surgery and may be able to restart 2 weeks later if there is no infection and healing is going well.  She will need clearance to restart Taltz from her surgeon.

## 2019-06-07 NOTE — Telephone Encounter (Signed)
Patient called stating she is scheduled for open heart surgery on 06/12/19 with Dr. Virgina Organ from [place] at [facility].  Patient states she has her pre-op appointment tomorrow and will keep Dr. Estanislado Pandy updated.  Please advise.

## 2019-06-12 ENCOUNTER — Ambulatory Visit: Payer: Medicare Other | Admitting: Physician Assistant

## 2019-06-12 DIAGNOSIS — Z8261 Family history of arthritis: Secondary | ICD-10-CM

## 2019-06-12 DIAGNOSIS — Q7142 Longitudinal reduction defect of left radius: Secondary | ICD-10-CM

## 2019-06-12 DIAGNOSIS — L4050 Arthropathic psoriasis, unspecified: Secondary | ICD-10-CM

## 2019-06-12 DIAGNOSIS — B182 Chronic viral hepatitis C: Secondary | ICD-10-CM

## 2019-06-12 DIAGNOSIS — Z79899 Other long term (current) drug therapy: Secondary | ICD-10-CM

## 2019-06-12 DIAGNOSIS — M170 Bilateral primary osteoarthritis of knee: Secondary | ICD-10-CM

## 2019-06-12 DIAGNOSIS — G5601 Carpal tunnel syndrome, right upper limb: Secondary | ICD-10-CM

## 2019-06-12 DIAGNOSIS — M47816 Spondylosis without myelopathy or radiculopathy, lumbar region: Secondary | ICD-10-CM

## 2019-06-12 DIAGNOSIS — D696 Thrombocytopenia, unspecified: Secondary | ICD-10-CM

## 2019-06-12 DIAGNOSIS — L409 Psoriasis, unspecified: Secondary | ICD-10-CM

## 2019-06-12 DIAGNOSIS — M79671 Pain in right foot: Secondary | ICD-10-CM

## 2019-06-12 DIAGNOSIS — D472 Monoclonal gammopathy: Secondary | ICD-10-CM

## 2019-07-03 ENCOUNTER — Other Ambulatory Visit: Payer: Self-pay | Admitting: Rheumatology

## 2019-07-03 DIAGNOSIS — L4050 Arthropathic psoriasis, unspecified: Secondary | ICD-10-CM

## 2019-07-03 DIAGNOSIS — L409 Psoriasis, unspecified: Secondary | ICD-10-CM

## 2019-07-13 ENCOUNTER — Telehealth: Payer: Self-pay | Admitting: Rheumatology

## 2019-07-13 NOTE — Telephone Encounter (Signed)
Patient advised she will need to have a clearance letter from her cardiologist to restart the Owen. Patient advised to have them send the clearance letter to the office. Patient advised we do need updated labs. Patient states she is due to have labs at the cardiologist. Patient advised we will need a CBC and CMP.

## 2019-07-13 NOTE — Telephone Encounter (Signed)
Patient calling in reference to Cardiologist appt this pm at 2 pm. Patient wants to know if she can go back on [place]. Patient having a hard time with weakness,  and very lethargic. This is something that has progressed the longer she has been off [employer].  Patient's open heart surgery was 06/12/2019. Please call to advise.

## 2019-07-20 ENCOUNTER — Telehealth: Payer: Self-pay | Admitting: Rheumatology

## 2019-07-20 NOTE — Telephone Encounter (Signed)
Patient called to check if we received the clearance letter from her cardiologist Dr. Glo Herring to restart her Taltz medication.  Patient requested a return call.

## 2019-07-20 NOTE — Telephone Encounter (Signed)
Patient advised we have not received the clearance for her to restart Taltz from her cardiologist. Patient will contact their office to have them send the clearance letter.

## 2019-08-03 ENCOUNTER — Telehealth: Payer: Self-pay

## 2019-08-03 NOTE — Telephone Encounter (Signed)
Patient states she has had several bacterial infections and is currently on antibiotics. Patient will finish antibiotics tomorrow (08/04/2019). Patient states her last dose of taltz was on or around 05/31/2019 and she had open heart surgery on 06/12/2019. Patient reports 5 hospital admissions since the surgery. Patient would like to resume taltz. I advised patient we would need a clearance letter from her cardiologist prior to resuming medication. Patient states she has reached out to Dr.Ferguson's office to obtain a clearance letter. I advised patient that she should not resume taltz until we have received appropriate clearance and have notified her that she can resume the medication.   I called Dr. Johnnye Sima office and advised them we need clearance for patient to resume taltz. Provided fax number. They are now sending the message to Dr. Glo Herring.

## 2019-08-21 ENCOUNTER — Telehealth: Payer: Self-pay | Admitting: Rheumatology

## 2019-08-21 NOTE — Telephone Encounter (Signed)
Attempted to contact the patient and left message for patient to call the office.  

## 2019-08-21 NOTE — Telephone Encounter (Signed)
Patient left a voicemail requesting a return call to discuss her medication.   

## 2019-08-28 ENCOUNTER — Telehealth: Payer: Self-pay | Admitting: Rheumatology

## 2019-08-28 NOTE — Telephone Encounter (Signed)
Patient left a voicemail stating she has questions regarding her Taltz injections.  Patient requested a return call.

## 2019-08-29 NOTE — Telephone Encounter (Signed)
Returned patient's call.  She wanted to discuss resuming Taltz.  Advised we received clearance from her GI provider but have not received clearance from her cardiologist, Dr. Glo Herring.  Patient states Dr. Glo Herring wants our office to reach out to his and that we are the experts in these medications and we should make that determination.  Patient has not been seen in office in almost a year.  She has a complex medical history with new conditions and events in the last year.  Recommend patient set up an appointment with our office and we can discuss resuming Taltz. Patient in agreement.  Transferred to the front desk to schedule an appointment.  Mariella Saa, PharmD, [place], CPP Clinical Specialty Pharmacist (Rheumatology and Pulmonology)  08/29/2019 9:44 AM

## 2019-09-01 NOTE — Progress Notes (Deleted)
Office Visit Note  Patient: Carol Horn             Date of Birth: 12/02/1945           MRN: [id number]             PCP: Chalmers Guest, [place] Referring: Chalmers Guest, FNP Visit Date: 09/14/2019 Occupation: @GUAROCC @  Subjective:  No chief complaint on file.   History of Present Illness: Carol Horn is a 74 y.o. female ***   Activities of Daily Living:  Patient reports morning stiffness for *** {minute/hour:19697}.   Patient {ACTIONS;DENIES/REPORTS:21021675::"Denies"} nocturnal pain.  Difficulty dressing/grooming: {ACTIONS;DENIES/REPORTS:21021675::"Denies"} Difficulty climbing stairs: {ACTIONS;DENIES/REPORTS:21021675::"Denies"} Difficulty getting out of chair: {ACTIONS;DENIES/REPORTS:21021675::"Denies"} Difficulty using hands for taps, buttons, cutlery, and/or writing: {ACTIONS;DENIES/REPORTS:21021675::"Denies"}  No Rheumatology ROS completed.   PMFS History:  Patient Active Problem List   Diagnosis Date Noted  . Thrombocytopenia ([place]) 07/27/2016  . Psoriasis 06/23/2016  . 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 High risk medication use 06/23/2016  . Chronic hepatitis C without hepatic coma (Carol Horn) 06/23/2016    No past medical history on file.  Family History  Problem Relation Age of Onset  . Alcohol abuse Mother   . Cirrhosis Mother   . Alcohol abuse Father   . Cirrhosis Father   . Heart attack Sister   . Celiac disease Daughter    Past Surgical History:  Procedure Laterality Date  . EYE SURGERY Bilateral 04/2017 05/2017   cataract extraction   . MOLE REMOVAL     on back   . TYMPANOSTOMY TUBE PLACEMENT Right 05/2017   Social History   Social History Narrative  . Not on file   Immunization History  Administered Date(s) Administered  . Hep A / Hep B 11/24/2016, 07/22/2017  . Hepatitis B 12/22/2016  . Influenza, High Dose Seasonal PF 10/09/2014, 10/29/2015, 11/24/2016, 11/23/2017  . PPD Test 11/19/2014  . Pneumococcal Conjugate-13  09/10/2014  . Pneumococcal Polysaccharide-23 08/16/2012  . Zoster 04/12/2012     Objective: Vital Signs: There were no vitals taken for this visit.   Physical Exam   Musculoskeletal Exam: ***  CDAI Exam: CDAI Score: -- Patient Global: --; Provider Global: -- Swollen: --; Tender: -- Joint Exam 09/14/2019   No joint exam has been documented for this visit   There is currently no information documented on the homunculus. Go to the Rheumatology activity and complete the homunculus joint exam.  Investigation: No additional findings.  Imaging: No results found.  Recent Labs: Lab Results  Component Value Date   WBC 5.8 02/01/2019   HGB 12.1 02/01/2019   PLT 134 (L) 02/01/2019   NA 144 02/01/2019   K 4.0 02/01/2019   CL 109 02/01/2019   CO2 25 02/01/2019   GLUCOSE 113 02/01/2019   BUN 19 02/01/2019   CREATININE 0.98 (H) 02/01/2019   BILITOT 0.8 02/01/2019   ALKPHOS 112 09/01/2016   AST 28 02/01/2019   ALT 13 02/01/2019   PROT 6.3 02/01/2019   ALBUMIN 3.5 09/01/2016   CALCIUM 9.5 02/01/2019   GFRAA 66 02/01/2019   QFTBGOLDPLUS NEGATIVE 02/01/2019    Speciality Comments: Prior therapy: Rutherford Nail (GI upset), Enbrel (inadequate response), Stelara (cost), PLQ (discontinued per Dr. Amalia Hailey- ophthalmologist)  Procedures:  No procedures performed Allergies: Amoxicillin, Hydrochlorothiazide, and Stelara [ustekinumab]   Assessment / Plan:     Visit Diagnoses: No diagnosis found.  Orders: No orders of the defined types were placed in this encounter.  No orders of the defined types were placed in  this encounter.   Face-to-face time spent with patient was *** minutes. Greater than 50% of time was spent in counseling and coordination of care.  Follow-Up Instructions: No follow-ups on file.   Earnestine Mealing, CMA  Note - This record has been created using Editor, commissioning.  Chart creation errors have been sought, but may not always  have been located. Such creation  errors do not reflect on  the standard of medical care.

## 2019-09-11 ENCOUNTER — Telehealth: Payer: Self-pay | Admitting: Rheumatology

## 2019-09-11 NOTE — Telephone Encounter (Signed)
Patient called requesting a return call to give update on starting the Taltz medication.  Patient states she has been in the hospital and is not sure she can keep her appointment in August.

## 2019-09-11 NOTE — Telephone Encounter (Signed)
Patient states she was recently in the hospital. Patient states she is not able to drive. Patient states she has been advised from her doctor to hold Taltz until they release her. Patient has been rescheduled to October and has been advised to hold her Carol Horn until release by her doctor and she has seen [place] in the office.

## 2019-09-14 ENCOUNTER — Ambulatory Visit: Payer: Medicare Other | Admitting: Physician Assistant

## 2019-10-04 ENCOUNTER — Telehealth: Payer: Self-pay | Admitting: Rheumatology

## 2019-10-04 NOTE — Telephone Encounter (Signed)
Patient left a voicemail checking if the office has received permission from Dr. Glo Herring to restart the Taltz medication.

## 2019-10-05 NOTE — Telephone Encounter (Addendum)
Patient advised we have not received clearance from Dr. Glo Herring. Patient will reach out to Dr. Johnnye Sima office regarding the clearance letter.

## 2019-10-06 ENCOUNTER — Telehealth: Payer: Self-pay | Admitting: Rheumatology

## 2019-10-06 NOTE — Telephone Encounter (Signed)
Patient called to check if you received the clearance letter from Dr. Glo Herring so she can start taking her Taltz medication.

## 2019-10-06 NOTE — Telephone Encounter (Signed)
Spoke with Olen Cordial and advised patient has a follow up appointment on 11/02/2019. Amedeo Kinsman we need a clearance letter from Dr. Glo Herring for patient to restart Taltz. He states he will follow up on letter and will fax to our office.

## 2019-10-06 NOTE — Telephone Encounter (Signed)
FYIOlen Cordial from Dr. Johnnye Sima office calling to see if patient had a follow up appointment scheduled for medication. Also, wanted to let you know you can call them if you need any information concerning patient's cardiac health.

## 2019-10-06 NOTE — Telephone Encounter (Signed)
Attempted to contact patient and left message to advise patient we have not received clearance for her to restart Taltz.

## 2019-10-09 ENCOUNTER — Telehealth: Payer: Self-pay | Admitting: Rheumatology

## 2019-10-09 NOTE — Telephone Encounter (Signed)
Will call patient once clearance letter is received.

## 2019-10-09 NOTE — Telephone Encounter (Signed)
Patient called stating she has called Dr. Glo Herring multiple times and was told he would send Dr. Estanislado Pandy the clearance letter by Friday, 10/06/19.  Patient is checking if you received it.

## 2019-10-09 NOTE — Telephone Encounter (Signed)
Patient called to check on the status of her clearance letter from Dr. Glo Herring.  Patient was told we haven't received the fax yet and that Seth Bake would call when it came in.  Patient states she is hoping the letter is received this week so she can have the injection before she leaves for her daughter's house in [place] for a month.

## 2019-10-09 NOTE — Telephone Encounter (Signed)
Patient advised we have not received the clearance letter as of yet. Patient advised spoke with Olen Cordial on 10/06/2019 and he stated he would follow through with getting it to our office.

## 2019-10-11 NOTE — Telephone Encounter (Signed)
Received clearance letter from cardiologist.  Patient has not been seen since January 4th, 2021.  Okay to resume Taltz or should patient schedule office visit first?

## 2019-10-11 NOTE — Telephone Encounter (Signed)
Please schedule office visit for routine follow and to restart on Taltz since there has been a long gap in therapy.

## 2019-10-11 NOTE — Progress Notes (Signed)
Office Visit Note  Patient: Carol Horn             Date of Birth: Feb 06, 1945           MRN: [id number]             PCP: Chalmers Guest, [place] Referring: Chalmers Guest, FNP Visit Date: 10/12/2019 Occupation: @GUAROCC @  Subjective:  Discuss restarting on Taltz   History of Present Illness: Carol Horn is a 74 y.o. female with history of psoriatic arthritis.  Patient's last dose of Taltz was on 06/01/2019.  She had open heart surgery on 06/12/2019 performed by Dr. Virgina Organ from [facility] heart [facility] vascular [facility] at [facility].  She has not restarted Taltz yet.  She has had several hospitalizations at [facility] over the past 1 year.  According to the patient she has been cleared by cardio as well as GI to resume Taltz.  She continues to have significant generalized weakness, shortness of breath, and fatigue.  She is also currently having diarrhea of unknown origin.  She reports she has been also experiencing severe lower back pain and feels like her "organs are going to fall out." She denies any radicular pain.  She denies any increased joint swelling at this time.  She has pedal edema bilaterally.  She has some psoriasis overlying the right knee but overall her psoriasis has been well controlled.  She would like to discuss restarting Taltz.     Activities of Daily Living:  Patient reports morning stiffness for 24 hours.   Patient Reports nocturnal pain.  Difficulty dressing/grooming: Reports Difficulty climbing stairs: Reports Difficulty getting out of chair: Reports Difficulty using hands for taps, buttons, cutlery, and/or writing: Reports  Review of Systems  Constitutional: Positive for fatigue.  HENT: Positive for mouth dryness. Negative for mouth sores and nose dryness.   Eyes: Positive for dryness. Negative for pain and itching.  Respiratory: Positive for shortness of breath. Negative for difficulty breathing.   Cardiovascular: Positive for swelling in legs/feet.  Negative for chest pain and palpitations.  Gastrointestinal: Positive for diarrhea. Negative for blood in stool and constipation.  Endocrine: Negative for increased urination.  Genitourinary: Negative for difficulty urinating.  Musculoskeletal: Positive for arthralgias, joint pain, myalgias, muscle weakness, morning stiffness, muscle tenderness and myalgias. Negative for joint swelling.  Skin: Negative for color change, rash and redness.  Allergic/Immunologic: Positive for susceptible to infections.  Neurological: Positive for dizziness, numbness and weakness. Negative for headaches and memory loss.  Hematological: Positive for bruising/bleeding tendency.  Psychiatric/Behavioral: Negative for confusion and sleep disturbance.    PMFS History:  Patient Active Problem List   Diagnosis Date Noted  . Thrombocytopenia ([place]) 07/27/2016  . Psoriasis 06/23/2016  . Psoriatic arthropathy ([place]) 06/23/2016  . High risk medication use 06/23/2016  . Chronic hepatitis C without hepatic coma ([place]) 06/23/2016    History reviewed. No pertinent past medical history.  Family History  Problem Relation Age of Onset  . Alcohol abuse Mother   . Cirrhosis Mother   . Alcohol abuse Father   . Cirrhosis Father   . Heart attack Sister   . Celiac disease Daughter    Past Surgical History:  Procedure Laterality Date  . EYE SURGERY Bilateral 04/2017 05/2017   cataract extraction   . MOLE REMOVAL     on back   . TYMPANOSTOMY TUBE PLACEMENT Right 05/2017  . VALVE REPLACEMENT  2021   Social History   Social History Narrative  . Not on  file   Immunization History  Administered Date(s) Administered  . Hep A / Hep B 11/24/2016, 07/22/2017  . Hepatitis B 12/22/2016  . Influenza, High Dose Seasonal PF 10/09/2014, 10/29/2015, 11/24/2016, 11/23/2017  . PFIZER SARS-COV-2 Vaccination 02/11/2019, 03/04/2019  . PPD Test 11/19/2014  . Pneumococcal Conjugate-13 09/10/2014  . Pneumococcal Polysaccharide-23  08/16/2012  . Zoster 04/12/2012     Objective: Vital Signs: BP 120/81 (BP Location: Right Arm, Patient Position: Sitting, Cuff Size: Normal)   Pulse 89   Resp 16   Ht 5' 2.5" (1.588 m)   Wt 163 lb 3.2 oz (74 kg)   BMI 29.37 kg/m    Physical Exam Vitals and nursing note reviewed.  Constitutional:      Appearance: She is well-developed.  HENT:     Head: Normocephalic and atraumatic.  Eyes:     Conjunctiva/sclera: Conjunctivae normal.  Pulmonary:     Effort: Pulmonary effort is normal.  Abdominal:     Palpations: Abdomen is soft.  Musculoskeletal:     Cervical back: Normal range of motion.  Skin:    General: Skin is warm and dry.     Capillary Refill: Capillary refill takes less than 2 seconds.  Neurological:     Mental Status: She is alert and oriented to person, place, and time.  Psychiatric:        Behavior: Behavior normal.      Musculoskeletal Exam: Limited ROM of C-spine.  Thoracic kyphosis noted. No midline spinal tenderness.  Tenderness over both SI joints.  Shoulder joints good ROM with no discomfort or tenderness.  Limited ROM of the right wrist joint.  PIP and DIP thickening and contractures noted.  She has good ROM of both knee joints with no warmth or effusion on exam.  No tenderness or warmth of ankle joints.  Pedal edema bilaterally.  No tenderness of MTP joints.   CDAI Exam: CDAI Score: -- Patient Global: --; Provider Global: -- Swollen: --; Tender: -- Joint Exam 10/12/2019   No joint exam has been documented for this visit   There is currently no information documented on the homunculus. Go to the Rheumatology activity and complete the homunculus joint exam.  Investigation: No additional findings.  Imaging: XR Lumbar Spine 2-3 Views  Result Date: 10/12/2019 Multilevel spondylosis was noted.  L1-L2 and L4-L5 significant narrowing with anterior osteophytes was noted.  Facet joint arthropathy was noted. Impression: These findings are consistent with  degenerative disc disease and facet joint arthropathy.  XR Pelvis 1-2 Views  Result Date: 10/12/2019 Bilateral SI joint sclerosis was noted.  No significant joint space narrowing was noted. Impression: These findings are consistent with bilateral SI joint sclerosis.   Recent Labs: Lab Results  Component Value Date   WBC 5.8 02/01/2019   HGB 12.1 02/01/2019   PLT 134 (L) 02/01/2019   NA 144 02/01/2019   K 4.0 02/01/2019   CL 109 02/01/2019   CO2 25 02/01/2019   GLUCOSE 113 02/01/2019   BUN 19 02/01/2019   CREATININE 0.98 (H) 02/01/2019   BILITOT 0.8 02/01/2019   ALKPHOS 112 09/01/2016   AST 28 02/01/2019   ALT 13 02/01/2019   PROT 6.3 02/01/2019   ALBUMIN 3.5 09/01/2016   CALCIUM 9.5 02/01/2019   GFRAA 66 02/01/2019   QFTBGOLDPLUS NEGATIVE 02/01/2019    Speciality Comments: Prior therapy: Rutherford Nail (GI upset), Enbrel (inadequate response), Stelara (cost), PLQ (discontinued per Dr. Amalia Hailey- ophthalmologist)  Procedures:  No procedures performed Allergies: Atorvastatin, Amoxicillin, Hydrochlorothiazide, Spironolactone, and Stelara [ustekinumab]  Assessment / Plan:     Visit Diagnoses: Psoriatic arthropathy ([place]) - Severe erosive psoriatic arthritis: She has no joint tenderness or synovitis on examination today.  She is not experiencing any increased joint pain but has noticed generalized joint stiffness recently.  Her psoriasis has been pretty well controlled but the patch on her right knee has started to return.  Her last dose of Taltz was on 06/01/2019 prior to having open heart surgery performed on 06/12/2019 by Dr. Glo Herring.  She has been cleared by Dr. Glo Herring to restart on Taltz.  She continues to have significant generalized weakness and fatigue following the surgery.  She is also being followed closely by her gastroenterologist at [facility].  She is being followed for history of gastric ulcer, hepatic cirrhosis.  She is currently having diarrhea on a daily basis.  Upon further chart  review, hepatitis B core antibody, hepatitis C antibody, and hepatitis a IgG and IgM were reactive on 08/22/2019.  She has a history of hepatitis C and was treated with Harvoni in the past.  She was unaware that her hepatitis A antibody was positive.  GI cleared her on 08/28/19 to restart Taltz, but we are apprehensive to start taltz while she continues to have diarrhea on a daily basis.  She was advised to follow up with GI for further evaluation of the diarrhea she is experiencing. She does not require aggressive immunosuppression at this time.  She has not had any recent flares while being off of taltz for the past several months.  She has not required a prednisone taper. Ideally restarting on otezla would be a great option for her but she could not tolerate otezla in the past due to experiencing diarrhea.  A referral to [facility] rheumatology will be placed today in order to better coordinate care with her gastroenterologist, cardiologist, and internist.  She will follow up in our office as needed.   Psoriasis: She has a large patch of psoriasis on the extensor surface of her right knee.  She is also noticed some patches of psoriasis in her ears.  High risk medication use - Previously on taltz-last dose 06/01/19. Inadequate response to Cosentyx 150 mg sq every 14 days (started August 2019). D/c otezla in the past-diarrhea.   Primary osteoarthritis of both knees - Moderate osteoarthritis and moderate chondromalacia patella: She has good range of motion of both knee joints on exam.  No warmth or effusion was noted.  Spondylosis without myelopathy or radiculopathy, lumbar region -She has chronic lower back pain.  Her symptoms have progressively been worsening.  She has no midline spinal tenderness on examination today.  She has tenderness over both SI joints.  According to the patient when rising from a seated position she feels as though her organs are going to" fall out."  X-rays of the lumbar spine and  pelvis were obtained today.  She has degenerative changes and facet joint arthropathy.  She will be following up with her PCP to discuss her referral to a spine specialist for further evaluation and management.  Plan: XR Lumbar Spine 2-3 Views  Chronic midline low back pain without sciatica -She has been experiencing increased lower back pain and stiffness.  She is not experiencing any symptoms of radiculopathy at this time.  X-rays of the lumbar spine were consistent with degenerative disc disease and facet joint arthropathy.  She will be reaching out to her PCP for a referral to a spine specialist for further evaluation.  Plan: XR Lumbar  Spine 2-3 Views, XR Pelvis 1-2 Views  Chronic SI joint pain - She has tenderness palpation over bilateral SI joints.  She is been experiencing increased lower back pain and stiffness.  X-rays of the pelvis were obtained which revealed sclerosis of both SI joints.  Plan: XR Pelvis 1-2 Views  Carpal tunnel syndrome of right wrist: Asymptomatic at this time.   Congenital absence of forearm only, left  Other medical conditions are listed as follows:   Thrombocytopenia ([place])  Chronic hepatitis C without hepatic coma ([facility]) - HCV RNA quant negative on 10/24/18.  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  Family history of psoriatic arthritis   Orders: Orders Placed This Encounter  Procedures  . XR Lumbar Spine 2-3 Views  . XR Pelvis 1-2 Views  . Ambulatory referral to Rheumatology   No orders of the defined types were placed in this encounter.     Follow-Up Instructions: Return if symptoms worsen or fail to improve, for Psoriatic arthritis, Osteoarthritis.   Ofilia Neas, PA-C  Note - This record has been created using Dragon software.  Chart creation errors have been sought, but may not always  have been located. Such creation errors do not reflect on  the standard of medical care.

## 2019-10-11 NOTE — Telephone Encounter (Signed)
Patient scheduled for 10/12/2019 at 8:40 am.

## 2019-10-12 ENCOUNTER — Ambulatory Visit: Payer: Self-pay

## 2019-10-12 ENCOUNTER — Ambulatory Visit: Payer: Medicare Other | Admitting: Physician Assistant

## 2019-10-12 ENCOUNTER — Encounter: Payer: Self-pay | Admitting: Physician Assistant

## 2019-10-12 ENCOUNTER — Other Ambulatory Visit: Payer: Self-pay

## 2019-10-12 VITALS — BP 120/81 | HR 89 | Resp 16 | Ht 62.5 in | Wt 163.2 lb

## 2019-10-12 DIAGNOSIS — L4050 Arthropathic psoriasis, unspecified: Secondary | ICD-10-CM | POA: Diagnosis not present

## 2019-10-12 DIAGNOSIS — M545 Low back pain: Secondary | ICD-10-CM | POA: Diagnosis not present

## 2019-10-12 DIAGNOSIS — G5601 Carpal tunnel syndrome, right upper limb: Secondary | ICD-10-CM

## 2019-10-12 DIAGNOSIS — D472 Monoclonal gammopathy: Secondary | ICD-10-CM

## 2019-10-12 DIAGNOSIS — M47816 Spondylosis without myelopathy or radiculopathy, lumbar region: Secondary | ICD-10-CM

## 2019-10-12 DIAGNOSIS — Z8261 Family history of arthritis: Secondary | ICD-10-CM

## 2019-10-12 DIAGNOSIS — L409 Psoriasis, unspecified: Secondary | ICD-10-CM | POA: Diagnosis not present

## 2019-10-12 DIAGNOSIS — Z79899 Other long term (current) drug therapy: Secondary | ICD-10-CM | POA: Diagnosis not present

## 2019-10-12 DIAGNOSIS — M533 Sacrococcygeal disorders, not elsewhere classified: Secondary | ICD-10-CM | POA: Diagnosis not present

## 2019-10-12 DIAGNOSIS — Z840 Family history of diseases of the skin and subcutaneous tissue: Secondary | ICD-10-CM

## 2019-10-12 DIAGNOSIS — Q7142 Longitudinal reduction defect of left radius: Secondary | ICD-10-CM

## 2019-10-12 DIAGNOSIS — M170 Bilateral primary osteoarthritis of knee: Secondary | ICD-10-CM

## 2019-10-12 DIAGNOSIS — B182 Chronic viral hepatitis C: Secondary | ICD-10-CM

## 2019-10-12 DIAGNOSIS — D696 Thrombocytopenia, unspecified: Secondary | ICD-10-CM

## 2019-10-12 DIAGNOSIS — Q7152 Longitudinal reduction defect of left ulna: Secondary | ICD-10-CM

## 2019-10-12 DIAGNOSIS — G8929 Other chronic pain: Secondary | ICD-10-CM | POA: Diagnosis not present

## 2019-10-12 NOTE — Patient Instructions (Signed)

## 2019-11-02 ENCOUNTER — Ambulatory Visit: Payer: Medicare Other | Admitting: Physician Assistant

## 2020-01-26 IMAGING — MR MR ABDOMEN WO/W CM
7 of 17 series · 19 of 48 positions shown · IV contrast (7ml eovist)
Comparison: Ultrasound 02/04/2018

CLINICAL DATA: Indeterminate liver lesion on ultrasound. History of
cirrhosis. MRI recommended for evaluation.

EXAM:
MRI ABDOMEN WITHOUT AND WITH CONTRAST
TECHNIQUE: Multiplanar multisequence MR imaging of the abdomen was performed
both before and after the administration of intravenous contrast.
CONTRAST:  7mL EOVIST GADOXETATE DISODIUM 0.25 MOL/L IV SOLN

[Series 3: T2 · coronal · 5.0mm · 1.41mm/px · 1 of 29 slices shown]
[im 1/29]
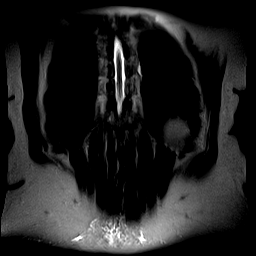

[Series 4: axial in out · axial · 6.0mm · 0.68mm/px · z∈[-121,+65]mm · 2 of 64 slices shown]
[im 1/64]
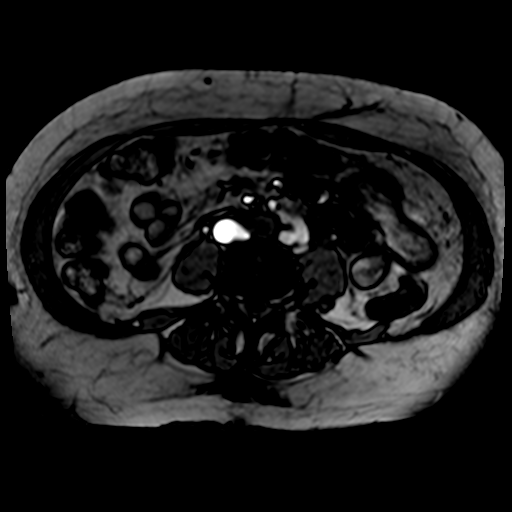
[im 64/64]
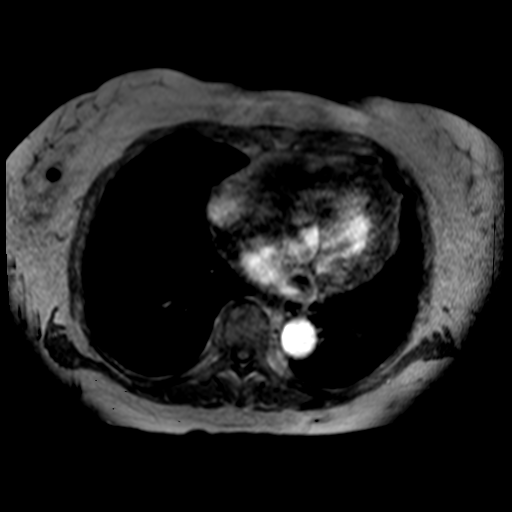

[Series 5: T1 dynamic · axial · non-contrast · 2.2mm · 0.72mm/px · z∈[-123,+68]mm · 3 of 88 slices shown]
[im 1/88]
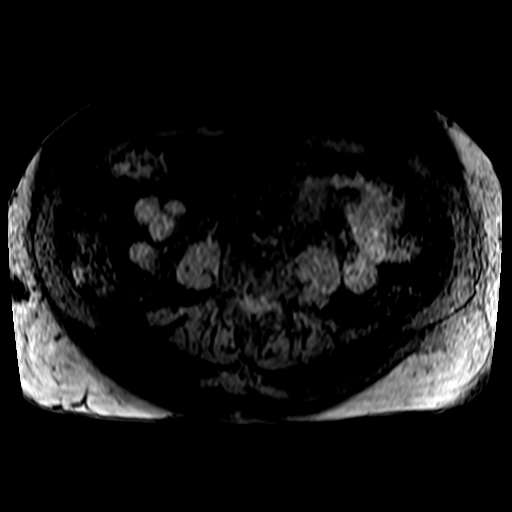
[im 44/88]
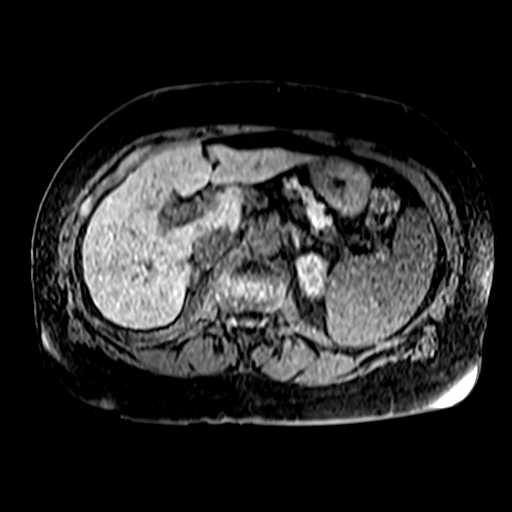
[im 88/88]
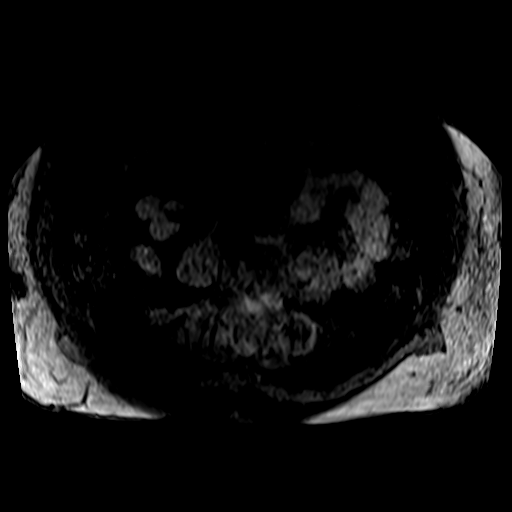

[Series 6: post immed · axial · 2.2mm · 0.72mm/px · z∈[-123,+68]mm · 3 of 88 slices shown]
[im 1/88]
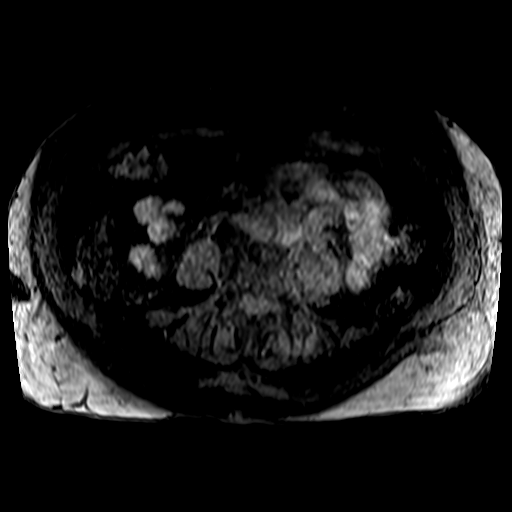
[im 44/88]
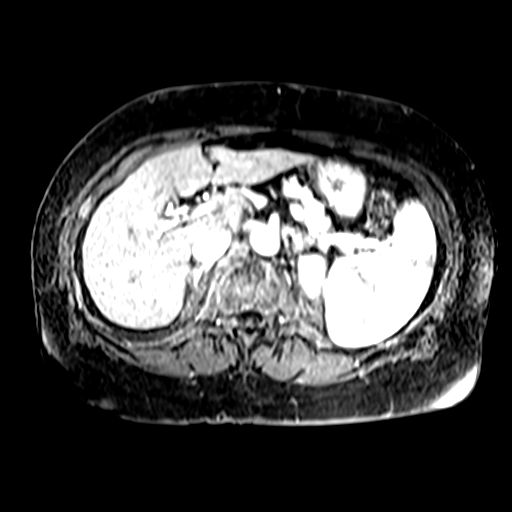
[im 88/88]
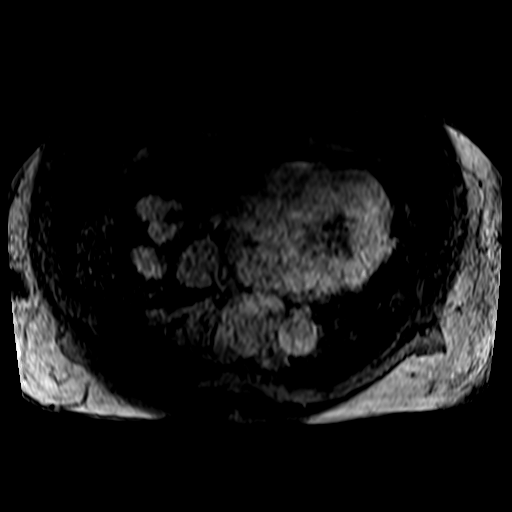

[Series 7: post immed_sub · axial · 2.2mm · 0.72mm/px · z∈[-123,+68]mm · 3 of 88 slices shown]
[im 1/88]
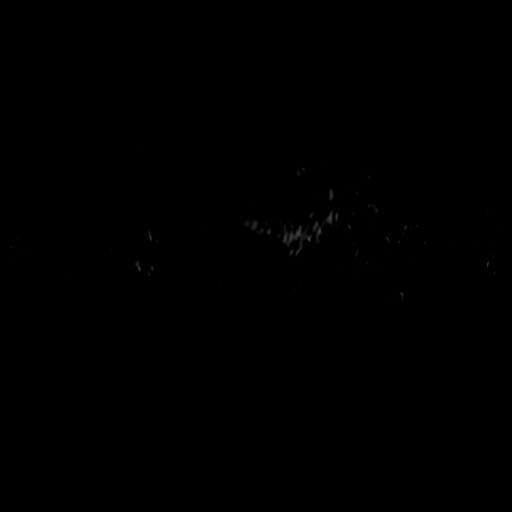
[im 44/88]
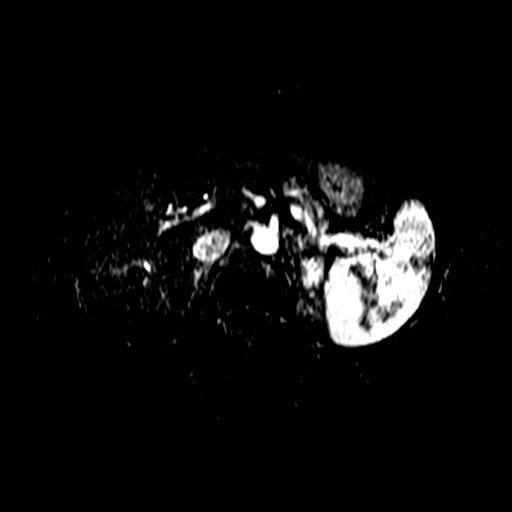
[im 88/88]
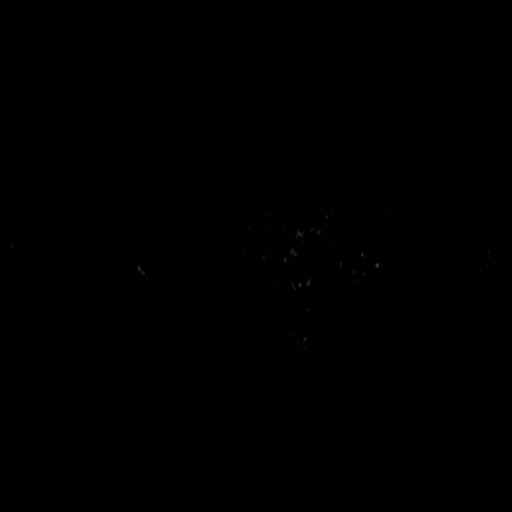

[Series 8: post 45 sec · axial · 2.2mm · 0.72mm/px · z∈[-123,+68]mm · 4 of 88 slices shown]
[im 1/88]
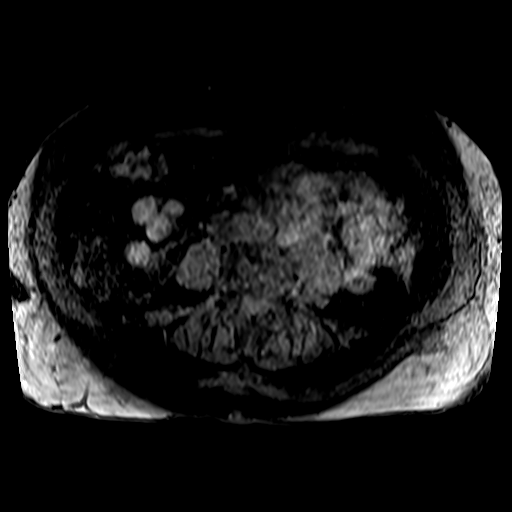
[im 30/88]
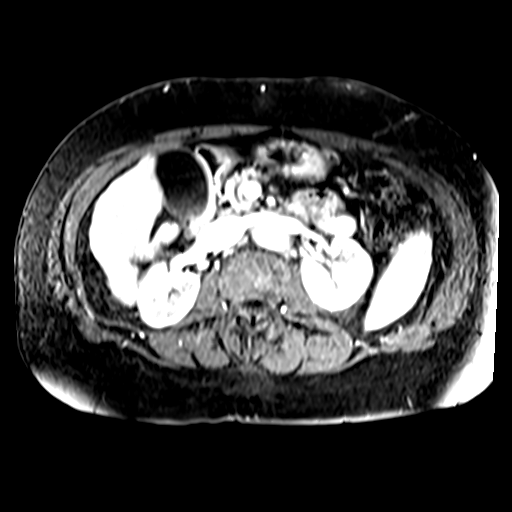
[im 59/88]
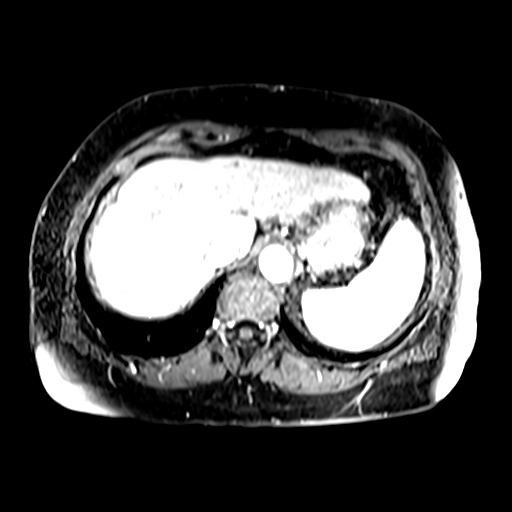
[im 88/88]
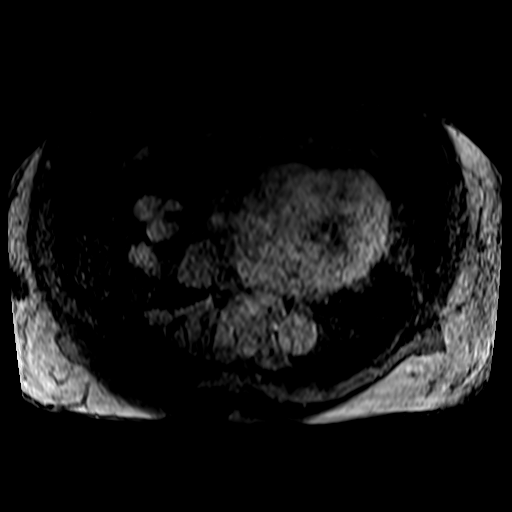

[Series 9: post 45 sec_sub · axial · 2.2mm · 0.72mm/px · z∈[-123,+4]mm · 3 of 88 slices shown]
[im 1/88]
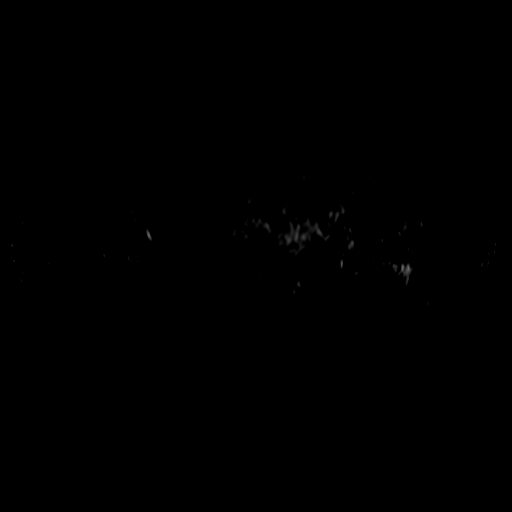
[im 30/88]
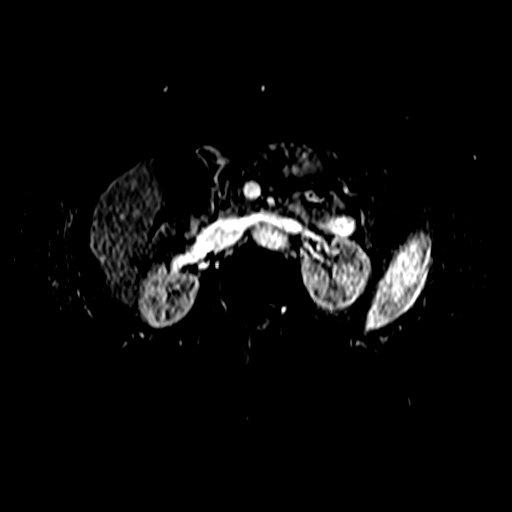
[im 59/88]
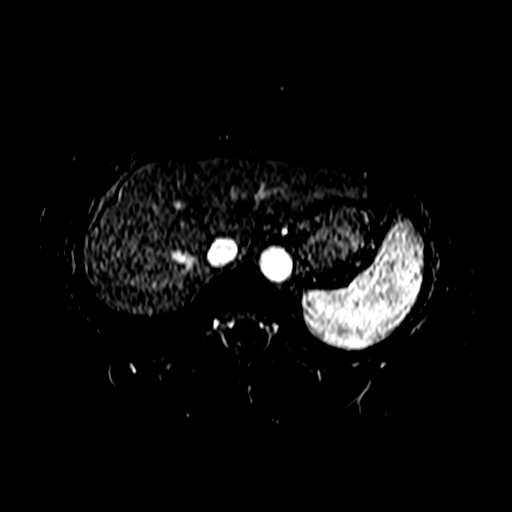

[19 of 48 positions shown; findings below may reference images not displayed]

FINDINGS: Lower chest:  Lung bases are clear.

Hepatobiliary: Liver has a fine nodular contour. Caudate lobe is
prominent.

There multiple small cystic lesions throughout the LEFT and RIGHT
hepatic lobe which are high signal intensity on T2 weighted imaging
and demonstrates no post-contrast enhancement consistent with benign
hepatic cysts.

No enhancing nodularity within the liver parenchyma to suggest
dysplastic nodules or hepatoma. Delayed 20 minutes Eovist imaging
sequence findings no suspicious lesions.

No intrahepatic biliary duct dilatation. The extrahepatic bile ducts
including the common bile duct are normal. Gallbladder normal.

Pancreas: Normal pancreatic parenchymal intensity. No ductal
dilatation or inflammation.

Spleen: Normal spleen.

Adrenals/urinary tract: Adrenal glands and kidneys are normal.

Stomach/Bowel: Stomach and limited of the small bowel is
unremarkable

Vascular/Lymphatic: Abdominal aortic normal caliber. No
retroperitoneal periportal lymphadenopathy.

Musculoskeletal: No aggressive osseous lesion
IMPRESSION: 1. No enhancing hepatic nodularity to suggest dysplastic nodules or
hepatoma.
2. Multiple small benign hepatic cysts.
3. Morphologic changes consistent with cirrhosis. No evidence of
fibrosis.
4. Normal volume spleen.  No ascites.

## 2020-02-27 DEATH — deceased
